# Patient Record
Sex: Male | Born: 1985 | Race: White | Hispanic: No | Marital: Single
Health system: Southern US, Community
[De-identification: ages and names within clinical notes are randomized; demographics above are authoritative.]

---

## 2022-03-04 ENCOUNTER — Other Ambulatory Visit: Payer: Self-pay

## 2022-03-04 ENCOUNTER — Encounter (HOSPITAL_COMMUNITY): Payer: Self-pay

## 2022-03-04 ENCOUNTER — Emergency Department (HOSPITAL_COMMUNITY)
Admission: EM | Admit: 2022-03-04 | Discharge: 2022-03-04 | Disposition: A | Discharge status: Home / Self Care | Payer: BC Managed Care – PPO | Attending: Emergency Medicine | Admitting: Emergency Medicine

## 2022-03-04 DIAGNOSIS — B9789 Other viral agents as the cause of diseases classified elsewhere: Secondary | ICD-10-CM | POA: Insufficient documentation

## 2022-03-04 DIAGNOSIS — R000 Tachycardia, unspecified: Secondary | ICD-10-CM | POA: Insufficient documentation

## 2022-03-04 DIAGNOSIS — Z20822 Contact with and (suspected) exposure to covid-19: Secondary | ICD-10-CM | POA: Insufficient documentation

## 2022-03-04 DIAGNOSIS — J040 Acute laryngitis: Secondary | ICD-10-CM | POA: Diagnosis not present

## 2022-03-04 DIAGNOSIS — J069 Acute upper respiratory infection, unspecified: Secondary | ICD-10-CM | POA: Diagnosis not present

## 2022-03-04 DIAGNOSIS — R059 Cough, unspecified: Secondary | ICD-10-CM | POA: Diagnosis present

## 2022-03-04 LAB — RESP PANEL BY RT-PCR (FLU A&B, COVID) ARPGX2
Influenza A by PCR: NEGATIVE
Influenza B by PCR: NEGATIVE
SARS Coronavirus 2 by RT PCR: NEGATIVE

## 2022-03-04 NOTE — ED Triage Notes (Signed)
Pt reports with sore throat and cough x 3 days.  ?

## 2022-03-04 NOTE — ED Provider Notes (Signed)
?[facility]-EMERGENCY DEPT ?Provider Note ? ? ?CSN: [id number] ?Arrival date & time: 03/04/22  2211 ? ?  ? ?History ? ?Chief Complaint  ?Patient presents with  ? Cough  ? Sore Throat  ? ? ?Richard Everett is a 36 y.o. male complaining of 2 to 3 days of a sore throat, cough and congestion.  Denies any sick contacts.  States he occasionally has chest pain and shortness of breath when he coughs however no other episodes of this.  No leg swelling, history of DVT/PE, fever or chills.  Says he has tried cough drops with him.  He is requesting a work note because he missed work the past few days. ? ? ?Cough ?Associated symptoms: sore throat   ?Sore Throat ? ? ?  ? ?Home Medications ?Prior to Admission medications   ?Not on File  ?   ? ?Allergies    ?Patient has no allergy information on record.   ? ?Review of Systems   ?Review of Systems  ?HENT:  Positive for sore throat and voice change.   ?Respiratory:  Positive for cough.   ? ?Physical Exam ?Updated Vital Signs ?BP (!) 142/89 (BP Location: Left Arm)   Pulse (!) 118   Temp 98.2 ?F (36.8 ?C) (Oral)   Resp 18   Ht 5\' 8"  (1.727 m)   Wt 70.3 kg   SpO2 97%   BMI 23.57 kg/m?  ?Physical Exam ?Vitals and nursing note reviewed.  ?Constitutional:   ?   General: He is not in acute distress. ?   Appearance: Normal appearance. He is not ill-appearing.  ?HENT:  ?   Head: Normocephalic and atraumatic.  ?   Nose: No congestion or rhinorrhea.  ?   Mouth/Throat:  ?   Mouth: Mucous membranes are moist.  ?   Pharynx: Oropharynx is clear.  ?   Tonsils: No tonsillar exudate or tonsillar abscesses.  ?Eyes:  ?   General: No scleral icterus. ?   Conjunctiva/sclera: Conjunctivae normal.  ?Cardiovascular:  ?   Rate and Rhythm: Regular rhythm. Tachycardia present.  ?Pulmonary:  ?   Effort: Pulmonary effort is normal. No respiratory distress.  ?   Breath sounds: Normal breath sounds. No wheezing.  ?Skin: ?   Findings: No rash.  ?Neurological:  ?   Mental Status: He is  alert.  ?Psychiatric:     ?   Mood and Affect: Mood normal.  ? ? ?ED Results / Procedures / Treatments   ?Labs ?(all labs ordered are listed, but only abnormal results are displayed) ?Labs Reviewed  ?RESP PANEL BY RT-PCR (FLU A&B, COVID) ARPGX2  ? ? ?EKG ?None ? ?Radiology ?No results found. ? ?Procedures ?Procedures  ? ?Medications Ordered in ED ?Medications - No data to display ? ?ED Course/ Medical Decision Making/ A&P ?  ?                        ?Medical Decision Making ? ?36 year old male presented due to URI symptoms.  Lung sounds were clear, airway clear and tolerating secretions.  He was offered a chest x-ray however he stated that he did not need this and only needed a work note for the past few days.  I told him that I would be able to write him a work note for today however because I did not evaluate him the previous days that I would not supply a note saying that he was excuse for work.  He voiced understanding  and ambulated out of the department with his discharge papers. ? ?We discussed over-the-counter treatment as well as return precautions.  Prior to his discharge, he admitted to taking kratom, which upon research is a herbal with euphoric results and that it causes his heart rate to be high.  He is convinced that this is why he is tachycardic.  Otherwise, I have no other concern for pulmonary embolus, low likelihood [place], and he will follow-up on COVID and flu results online ? ?Final Clinical Impression(s) / ED Diagnoses ?Final diagnoses:  ?Laryngitis  ?Viral URI with cough  ? ? ?Rx / DC Orders ? ? ?Results and diagnoses were explained to the patient. Return precautions discussed in full. Patient had no additional questions and expressed complete understanding. ? ? ?This chart was dictated using voice recognition software.  Despite best efforts to proofread,  errors can occur which can change the documentation meaning.  ?  ?Saddie Benders, PA-C ?03/04/22 2344 ? ?  ?Benjiman Core,  MD ?03/04/22 2356 ? ?

## 2022-03-04 NOTE — Discharge Instructions (Signed)
You are most likely experiencing allergies which is causing you to have phlegm, sore throat and cough.  You should start taking a medication such as Claritin, Zyrtec, Allegra or Xyzal daily.  Your work note is attached ?

## 2022-03-22 ENCOUNTER — Emergency Department (HOSPITAL_COMMUNITY)
Admission: EM | Admit: 2022-03-22 | Discharge: 2022-03-22 | Disposition: A | Discharge status: Home / Self Care | Payer: BC Managed Care – PPO | Attending: Emergency Medicine | Admitting: Emergency Medicine

## 2022-03-22 ENCOUNTER — Other Ambulatory Visit: Payer: Self-pay

## 2022-03-22 DIAGNOSIS — Z5900 Homelessness unspecified: Secondary | ICD-10-CM | POA: Insufficient documentation

## 2022-03-22 DIAGNOSIS — F989 Unspecified behavioral and emotional disorders with onset usually occurring in childhood and adolescence: Secondary | ICD-10-CM | POA: Diagnosis present

## 2022-03-22 DIAGNOSIS — F919 Conduct disorder, unspecified: Secondary | ICD-10-CM | POA: Diagnosis not present

## 2022-03-22 DIAGNOSIS — R4689 Other symptoms and signs involving appearance and behavior: Secondary | ICD-10-CM

## 2022-03-22 NOTE — ED Triage Notes (Signed)
Pt arrives via [employer] who were reportedly called out for "a naked man on the sidewalk". Pt was in no distress stated "I just wanted to be naked". Denies SI/HI. No AVH. When asked about recent drug/alcohol use pt states "I don't think so, can you check". Pt states he is homeless, from [place]. Here in [place] because "my truck ran out of gas".  ?

## 2022-03-22 NOTE — ED Notes (Signed)
Patient changed into scrubs and wanded by security. ?

## 2022-03-22 NOTE — ED Provider Notes (Signed)
?[facility]-EMERGENCY DEPT ?Provider Note ? ? ?CSN: [id number] ?Arrival date & time: 03/22/22  1738 ? ?  ? ?History ?Pmh: substance use disorder ?Chief Complaint  ?Patient presents with  ? Mental Health Problem  ? Homeless  ? ? ?Richard Everett is a 36 y.o. male. ?Patient presents with for mental health evaluation.  Apparently he was arrested earlier this afternoon after being found breaking into cars.  He was found to have heroin on him.  He was released from jail today and was at a friend's house when he was reportedly running around naked outside.  Neighbor called EMS on him.  Apparently patient is homeless right now and is traveling from [place].  He denies any suicidal or homicidal ideations.  Denies any illicit lesions.  He denies any recent drug or alcohol use.  Denies any mental health disorders and is not on any medications. ? ? ?Mental Health Problem ?Presenting symptoms: no agitation, no hallucinations and no suicidal thoughts   ?Associated symptoms: no anxiety   ? ?  ? ?Home Medications ?Prior to Admission medications   ?Not on File  ?   ? ?Allergies    ?Patient has no known allergies.   ? ?Review of Systems   ?Review of Systems  ?Psychiatric/Behavioral:  Positive for behavioral problems. Negative for agitation, confusion, hallucinations, sleep disturbance and suicidal ideas. The patient is not nervous/anxious.   ?All other systems reviewed and are negative. ? ?Physical Exam ?Updated Vital Signs ?BP 128/88 (BP Location: Right Arm)   Pulse 97   Temp 99.2 ?F (37.3 ?C) (Oral)   Resp 16   Ht 5\' 8"  (1.727 m)   Wt 57.6 kg   SpO2 100%   BMI 19.31 kg/m?  ?Physical Exam ?Vitals and nursing note reviewed.  ?Constitutional:   ?   General: He is not in acute distress. ?   Appearance: Normal appearance. He is well-developed. He is not ill-appearing, toxic-appearing or diaphoretic.  ?HENT:  ?   Head: Normocephalic and atraumatic.  ?   Nose: No nasal deformity.  ?   Mouth/Throat:  ?   Lips:  Pink. No lesions.  ?Eyes:  ?   General: Gaze aligned appropriately. No scleral icterus.    ?   Right eye: No discharge.     ?   Left eye: No discharge.  ?   Conjunctiva/sclera: Conjunctivae normal.  ?   Right eye: Right conjunctiva is not injected. No exudate or hemorrhage. ?   Left eye: Left conjunctiva is not injected. No exudate or hemorrhage. ?Pulmonary:  ?   Effort: Pulmonary effort is normal. No respiratory distress.  ?Skin: ?   General: Skin is warm and dry.  ?Neurological:  ?   Mental Status: He is alert and oriented to person, place, and time.  ?Psychiatric:     ?   Attention and Perception: Attention and perception normal. He is attentive. He does not perceive auditory or visual hallucinations.     ?   Mood and Affect: Mood and affect normal. Mood is not anxious, depressed or elated. Affect is not labile, blunt, flat, angry, tearful or inappropriate.     ?   Speech: Speech normal. He is communicative. Speech is not rapid and pressured, delayed, slurred or tangential.     ?   Behavior: Behavior normal. Behavior is not agitated, slowed, aggressive, withdrawn, hyperactive or combative. Behavior is cooperative.     ?   Thought Content: Thought content normal. Thought content is not paranoid  or delusional. Thought content does not include homicidal or suicidal ideation. Thought content does not include homicidal or suicidal plan.     ?   Cognition and Memory: Cognition and memory normal. Cognition is not impaired. Memory is not impaired. He does not exhibit impaired recent memory or impaired remote memory.     ?   Judgment: Judgment normal. Judgment is not impulsive or inappropriate.  ? ? ?ED Results / Procedures / Treatments   ?Labs ?(all labs ordered are listed, but only abnormal results are displayed) ?Labs Reviewed - No data to display ? ?EKG ?None ? ?Radiology ?No results found. ? ?Procedures ?Procedures  ? ? ?Medications Ordered in ED ?Medications - No data to display ? ?ED Course/ Medical Decision  Making/ A&P ?  ?                        ?Medical Decision Making ? ? ?MDM  ?This is a 36 y.o. male who presents to the ED for running around naked in public ? ?My Impression, Plan, and ED Course: Patient is calm and cooperative.  He denies any suicidal or homicidal ideations.  Not appear to be responding to any external stimuli and reports no history of hallucinations he was brought in because he was running around naked and EMS was called on him. He is homeless and would like something to eat. Report from EMS states that he was arrested earlier today and heroin was found on him so this may be why he was acting strange. At this point, I do not think that he is a harm to himself or others. No evidence of an acute psychiatric process going on.  He wants to be discharged at this time. No further workup needed.  ? ? ? ? ?Charting Requirements ?Additional history is obtained from:  Independent historian ?External Records from outside source obtained and reviewed including: n/a ?Social Determinants of Health:  homeless, from out of town ?Pertinant PMH that complicates patient's illness: homelessness, substance use ? ?Patient Care ?Problems that were addressed during this visit: ?- Behavior concern: Acute illness ?- homeless: Acute illness ?Disposition: Discharge. Return precautions ? ?This is a supervised visit with my attending physician, Dr. Freida Busman. We have discussed this patient and they have altered the plan as needed. ? ?Portions of this note were generated with Scientist, clinical (histocompatibility and immunogenetics). Dictation errors may occur despite best attempts at proofreading. ?  ? ?Final Clinical Impression(s) / ED Diagnoses ?Final diagnoses:  ?Homelessness  ?Abnormal behavior  ? ? ?Rx / DC Orders ?ED Discharge Orders   ? ? None  ? ?  ? ? ?  ?Claudie Leach, PA-C ?03/22/22 1915 ? ?  ?Lorre Nick, MD ?03/22/22 2314 ? ?

## 2022-03-22 NOTE — ED Notes (Signed)
Pt changed into purple scrubs. Belongings stored in cabinet by nurses station 16-22.  ?

## 2022-03-22 NOTE — Discharge Instructions (Signed)
Please return if you develop any suicidal or homicidal thoughts.  ?

## 2022-04-11 ENCOUNTER — Emergency Department (HOSPITAL_COMMUNITY): Payer: BC Managed Care – PPO

## 2022-04-11 ENCOUNTER — Emergency Department (HOSPITAL_COMMUNITY)
Admission: EM | Admit: 2022-04-11 | Discharge: 2022-04-12 | Disposition: A | Discharge status: Home / Self Care | Payer: BC Managed Care – PPO | Attending: Emergency Medicine | Admitting: Emergency Medicine

## 2022-04-11 ENCOUNTER — Encounter (HOSPITAL_COMMUNITY): Payer: Self-pay | Admitting: Emergency Medicine

## 2022-04-11 DIAGNOSIS — R4182 Altered mental status, unspecified: Secondary | ICD-10-CM | POA: Diagnosis not present

## 2022-04-11 DIAGNOSIS — E876 Hypokalemia: Secondary | ICD-10-CM | POA: Insufficient documentation

## 2022-04-11 LAB — COMPREHENSIVE METABOLIC PANEL
ALT: 30 U/L (ref 0–44)
AST: 28 U/L (ref 15–41)
Albumin: 3 g/dL — ABNORMAL LOW (ref 3.5–5.0)
Alkaline Phosphatase: 53 U/L (ref 38–126)
Anion gap: 6 (ref 5–15)
BUN: 15 mg/dL (ref 6–20)
CO2: 27 mmol/L (ref 22–32)
Calcium: 8.2 mg/dL — ABNORMAL LOW (ref 8.9–10.3)
Chloride: 107 mmol/L (ref 98–111)
Creatinine, Ser: 0.96 mg/dL (ref 0.61–1.24)
GFR, Estimated: >60 mL/min (ref >60)
Glucose, Bld: 108 mg/dL — ABNORMAL HIGH (ref 70–99)
Potassium: 3.2 mmol/L — ABNORMAL LOW (ref 3.5–5.1)
Sodium: 140 mmol/L (ref 135–145)
Total Bilirubin: 0.9 mg/dL (ref 0.3–1.2)
Total Protein: 5.9 g/dL — ABNORMAL LOW (ref 6.5–8.1)

## 2022-04-11 LAB — CBC
HCT: 28.8 % — ABNORMAL LOW (ref 39.0–52.0)
Hemoglobin: 10.1 g/dL — ABNORMAL LOW (ref 13.0–17.0)
MCH: 33.7 pg (ref 26.0–34.0)
MCHC: 35.1 g/dL (ref 30.0–36.0)
MCV: 96 fL (ref 80.0–100.0)
Platelets: 204 10*3/uL (ref 150–400)
RBC: 3 MIL/uL — ABNORMAL LOW (ref 4.22–5.81)
RDW: 12.1 % (ref 11.5–15.5)
WBC: 7.1 10*3/uL (ref 4.0–10.5)
nRBC: 0 % (ref 0.0–0.2)

## 2022-04-11 LAB — ETHANOL: Alcohol, Ethyl (B): <10 mg/dL (ref <10)

## 2022-04-11 LAB — CBG MONITORING, ED: Glucose-Capillary: 103 mg/dL — ABNORMAL HIGH (ref 70–99)

## 2022-04-11 MED ORDER — POTASSIUM CHLORIDE 10 MEQ/100ML IV SOLN
10.0000 meq | INTRAVENOUS | Status: AC
  Administered 2022-04-11 – 2022-04-12 (×3): 10 meq via INTRAVENOUS
  Filled 2022-04-11 (×3): qty 100

## 2022-04-11 NOTE — Discharge Instructions (Addendum)
Substance Abuse Treatment Programs ° °Intensive Outpatient Programs °[facility]     °[address]      °[place], [place]                   °[phone]      ° °[facility] °[address] °[place], [place] °[phone] ° °[facility]     °(Inpatient and outpatient)     °[address]           °[phone]   ° °[facility] °[phone] (Suboxone and Methadone) ° °[address]      °[place], [place] [address]      °[phone]      ° °[address] °[place], [place] °[phone] ° °[facility] (Outpatient/Inpatient, Chemical)    °(insurance only) [phone]      °       °[facility] (Groups & Residential) °[place], [place] °[phone] ° °   °[facility]     °[address]     °[place], [place]      °[phone]      ° °[facility] (for caregivers and family) °[address] °[place], [place] °336-299-4655 ° ° ° ° ° °Residential Treatment Programs °[facility]      °[address], [place], [place] [address]  °[phone]      ° °[facility] °[address] , [place] [address] °[phone] ° °[facility]        °336-248-8914      ° °[facility] °1-800-659-3381 ° °[facility]             °[address]                                         °[place], [place]                                                °[phone] or 336-784-9470                              ° °[facility] [place] °[address] °[place], [address] °[phone] ° °[facility]    °[address]      °[place], [place]     °[phone]      ° °[facility] °[address] °[place], [place] °336-285-9073 ° °[facility]   °[address]     °[place], [place] [address]     °[phone]      °Admissions: 8am-3pm M-F ° °[facility] °[address] °[place],  [place] °336-227-7417 ° °[facility]: Residential Program (90 Days)   °[place], [place]      °336-725-8389 or [phone]    ° °[facility] °[place], [place] °(Walk in Hours over the weekend or by referral) ° °[facility] °[address], [place],  [address] °([phone] ° °Crisis Mobile: [facility]:  1-877-626-1772 (for crisis response 24 hours a day) °[facility] Hotline:      1-800-256-2452 °Outpatient Psychiatry and Counseling ° °[facility]: Mobile Crisis   Management 24 hours:  1-877-626-1772 ° °[facility] sliding scale fee and walk in schedule: M-F 8am-12pm/1pm-3pm °[address]  °[place], [place] [address] °[phone] ° °[facility] °[address] °[place], [place] [address] °[phone] ° °[facility] ([facility] new patient walk-in appointments available Monday - Friday 8am -3pm.          °[address] °[place], [place] [address] °336-676-6840 or crisis line- 336-676-6905 ° °[facility] Intensive Outpatient Therapy Program °[address] °[place], [place] [address] °336-832-9804 ° °[facility]                  °Crisis Services      °[phone]      °[address]     °[place], [place] [address]                ° °[facility]   °[facility] °[phone] °[address] °[place], [place] [address] ° ° °[facility]          °[address]  °[place], [place] [address]       °[phone] ° °[facility] °[address] °[place], [place] [address] °336-292-1510 ° °[facility]    °[address]    °[place], [place] [address]     °[phone]      ° °Parish McKinney, MD     °[address]     °[place] [address]     °[phone]     °  °[facility] °[address] °[place] [address] ° °[facility]     °[address]     °[place], [place]      °336-542-2076      ° °[facility] °Shamsher Ahluwalia, MD °[address] °[place], [place] [address] °[phone] ° °[facility]     °[address]     °[place], [place] [address]     °336-274-1237      ° °[facility] °[address] °[place], [place] [address] °336-854-4450 °Resources for Temporary Residential Assistance/Crisis Centers ° °DAY CENTERS °[facility] °M-F 8am-3pm   °[address] [place], [place] [address]   336-332-0824 °Services include: laundry, barbering, support groups, case management, phone  & computer access, showers, AA/NA mtgs, mental health/substance abuse nurse, job skills class, disability information, VA assistance, spiritual classes, etc.  ° °HOMELESS SHELTERS ° °[facility]     °[facility]   °[address], [place]     °[phone]       °       °[facility] (women and children)       °[address] °[place], [place] [address] °336-275-0820 °[email or web address] for application and process °Application Required ° °[facility]   °[address]    °[place] [address]     °[phone]       °             °[facility] °[address] °[place], [place] [address] °336.273.5572 °336-235-0363(schedule application appt.) °Application Required ° °[facility] (women only)    °[address]     °[place],  [address]     °336-884-1039      °  Intake starts 6pm daily °Need valid ID, SSC, & Police report °[facility] [place] °[address] °[place], [place] °336-881-5420 °Application Required ° °[facility] (men only)     °[address]      °[place], [place]     °[phone]      ° °[facility] °(Pregnant women only) °[address] °[place], [place] °336-275-0206 ° °[facility]      °[address]      °[place], [place] [address]     °[phone]      °       °[facility] °[address] °[place], [place] °336-723-1848 °90 day commitment/SA/Application process ° °[facility](men only)     °[address]     °[place], [place]     °[phone]       °Check-in at 7pm     °       °[facility] °[address] °[place], [place] [address] °336-248-6684 °Men/Women/Women and Children must be there by 7 pm ° °[facility] °[place], [place] °336-722-8721                ° °

## 2022-04-11 NOTE — ED Provider Notes (Signed)
[facility] EMERGENCY DEPARTMENT Provider Note   CSN: [id number] Arrival date & time: 04/11/22  2116     History  No chief complaint on file.   Richard Everett is a 36 y.o. male with a history of polysubstance abuse and homelessness presenting to the ED with altered mental status.  Per EMS, patient was found in a storm drain and was removed by police.  No reports that he fell into the storm drain.  On EMS arrival, he was alert but disoriented and intermittently agitated on route.  No seizure-like activity.  He initially would respond to his name but would not answer questions.  On arrival to the ED, patient is altered and unable to answer questions or provide further history.  HPI     Home Medications Prior to Admission medications   Not on File      Allergies    Patient has no known allergies.    Review of Systems   Review of Systems  Unable to perform ROS: Mental status change   Physical Exam Updated Vital Signs BP 138/80   Pulse (!) 36   Temp 98.3 F (36.8 C) (Temporal)   Resp (!) 21   SpO2 91%  Physical Exam Constitutional:      Appearance: He is normal weight.     Comments: Disheveled and chronically ill-appearing. He is initially alert and combative on arrival but will intermittently fall asleep.  Upon attempting to open his eyes, he will intermittently fight opening his eyes.  HENT:     Head: Normocephalic and atraumatic.     Right Ear: External ear normal.     Left Ear: External ear normal.     Nose: Nose normal.     Mouth/Throat:     Pharynx: Oropharynx is clear.     Comments: Poor dentition. Eyes:     General: No scleral icterus.    Comments: Fields are 6 mm and reactive bilaterally.  Neck:     Comments: C-collar in place.  No midline C-spine tenderness, step-offs, or deformities. Cardiovascular:     Rate and Rhythm: Normal rate and regular rhythm.     Pulses: Normal pulses.     Heart sounds: Normal heart sounds. No murmur  heard.   No friction rub. No gallop.  Pulmonary:     Effort: Pulmonary effort is normal. No respiratory distress.     Breath sounds: Normal breath sounds. No stridor. No wheezing, rhonchi or rales.  Abdominal:     General: There is no distension.     Palpations: Abdomen is soft.     Tenderness: There is no abdominal tenderness. There is no guarding or rebound.  Musculoskeletal:        General: No deformity.     Cervical back: Neck supple.     Right lower leg: No edema.     Left lower leg: No edema.  Skin:    General: Skin is warm and dry.  Neurological:     Comments: Patient unable to answer questions.  He will withdrawal to pain.  He has intermittent episodes where his body will tense and he appears agitated.  No eye deviation or tachycardia with these episodes and they do not appear to be consistent with seizures.  He is able to move all extremities spontaneously. 3 beat clonus in the bilateral lower extremities.    ED Results / Procedures / Treatments   Labs (all labs ordered are listed, but only abnormal results are displayed) Labs Reviewed  CBC - Abnormal; Notable for the following components:      Result Value   RBC 3.00 (*)    Hemoglobin 10.1 (*)    HCT 28.8 (*)    All other components within normal limits  COMPREHENSIVE METABOLIC PANEL - Abnormal; Notable for the following components:   Potassium 3.2 (*)    Glucose, Bld 108 (*)    Calcium 8.2 (*)    Total Protein 5.9 (*)    Albumin 3.0 (*)    All other components within normal limits  CBG MONITORING, ED - Abnormal; Notable for the following components:   Glucose-Capillary 103 (*)    All other components within normal limits  ETHANOL  RAPID URINE DRUG SCREEN, HOSP PERFORMED  CBG MONITORING, ED    EKG EKG Interpretation  Date/Time:  Friday April 11 2022 21:23:09 EDT Ventricular Rate:  91 PR Interval:  131 QRS Duration: 99 QT Interval:  385 QTC Calculation: 474 R Axis:   88 Text Interpretation: Sinus rhythm  Nonspecific T wave abnormality No previous tracing Confirmed by Cathren Laine ([id number]) on 04/11/2022 11:28:00 PM  Radiology CT Head Wo Contrast  Result Date: 04/11/2022 CLINICAL DATA:  Disoriented EXAM: CT HEAD WITHOUT CONTRAST CT CERVICAL SPINE WITHOUT CONTRAST TECHNIQUE: Multidetector CT imaging of the head and cervical spine was performed following the standard protocol without intravenous contrast. Multiplanar CT image reconstructions of the cervical spine were also generated. RADIATION DOSE REDUCTION: This exam was performed according to the departmental dose-optimization program which includes automated exposure control, adjustment of the mA and/or kV according to patient size and/or use of iterative reconstruction technique. COMPARISON:  None Available. FINDINGS: CT HEAD FINDINGS Brain: No evidence of acute infarction, hemorrhage, hydrocephalus, extra-axial collection or mass lesion/mass effect. Vascular: No hyperdense vessel or unexpected calcification. Skull: Normal. Negative for fracture or focal lesion. Sinuses/Orbits: Mucosal thickening in the sinuses Other: None CT CERVICAL SPINE FINDINGS Alignment: Straightening of the cervical spine. No subluxation. Facet alignment within normal limits. Skull base and vertebrae: No acute fracture. No primary bone lesion or focal pathologic process. Soft tissues and spinal canal: No prevertebral fluid or swelling. No visible canal hematoma. Disc levels:  Within normal limits Upper chest: Negative. Other: None IMPRESSION: 1. Negative non contrasted CT appearance of the brain. 2. Straightening of the cervical spine. No acute osseous abnormality. Electronically Signed   By: Jasmine Pang M.D.   On: 04/11/2022 22:10   CT Cervical Spine Wo Contrast  Result Date: 04/11/2022 CLINICAL DATA:  Disoriented EXAM: CT HEAD WITHOUT CONTRAST CT CERVICAL SPINE WITHOUT CONTRAST TECHNIQUE: Multidetector CT imaging of the head and cervical spine was performed following the standard  protocol without intravenous contrast. Multiplanar CT image reconstructions of the cervical spine were also generated. RADIATION DOSE REDUCTION: This exam was performed according to the departmental dose-optimization program which includes automated exposure control, adjustment of the mA and/or kV according to patient size and/or use of iterative reconstruction technique. COMPARISON:  None Available. FINDINGS: CT HEAD FINDINGS Brain: No evidence of acute infarction, hemorrhage, hydrocephalus, extra-axial collection or mass lesion/mass effect. Vascular: No hyperdense vessel or unexpected calcification. Skull: Normal. Negative for fracture or focal lesion. Sinuses/Orbits: Mucosal thickening in the sinuses Other: None CT CERVICAL SPINE FINDINGS Alignment: Straightening of the cervical spine. No subluxation. Facet alignment within normal limits. Skull base and vertebrae: No acute fracture. No primary bone lesion or focal pathologic process. Soft tissues and spinal canal: No prevertebral fluid or swelling. No visible canal hematoma. Disc levels:  Within normal limits  Upper chest: Negative. Other: None IMPRESSION: 1. Negative non contrasted CT appearance of the brain. 2. Straightening of the cervical spine. No acute osseous abnormality. Electronically Signed   By: Jasmine PangKim  Fujinaga M.D.   On: 04/11/2022 22:10   DG Chest Portable 1 View  Result Date: 04/11/2022 CLINICAL DATA:  Altered mental status. EXAM: PORTABLE CHEST 1 VIEW COMPARISON:  None Available. FINDINGS: The heart size and mediastinal contours are within normal limits. Both lungs are clear. The visualized skeletal structures are unremarkable. IMPRESSION: No active disease. Electronically Signed   By: Darliss CheneyAmy  Guttmann M.D.   On: 04/11/2022 21:40    Procedures Procedures    Medications Ordered in ED Medications  potassium chloride 10 mEq in 100 mL IVPB (10 mEq Intravenous New Bag/Given 04/11/22 2222)    ED Course/ Medical Decision Making/ A&P                            Medical Decision Making Amount and/or Complexity of Data Reviewed Labs: ordered. Radiology: ordered.  Risk Prescription drug management.   36 year old male with a history of polysubstance use and homelessness presenting to the ED with altered mental status  On exam, the patient is afebrile and hemodynamically stable.  He is altered and unable to answer questions but is maintaining his own airway and breathing normally.  He does intermittently have episodes where his entire body will tense but he then he appears somewhat combative and will then calm.  He does not have eye deviation or tachycardia with these episodes that do not appear to be consistent with seizures.  He is noted to have some clonus in your lower extremities.  Unclear if the patient had a traumatic event, however he does not have any obvious trauma on my examination.  However, given his altered mental status and unclear nature of the surrounding circumstances, CT head and CT C-spine were obtained.  Chest x-ray also obtained.  I think his altered mental status is most likely due to polysubstance use, will evaluate for electrolyte abnormalities or intracranial abnormality.  CT head without acute intracranial abnormality.  CT C-spine without acute traumatic injury.  Chest x-ray is within normal limits.  CBC without leukocytosis and with a hemoglobin of 10.1.  CMP with mild hypokalemia to 3.2.  Patient given repletion.  CMP otherwise unremarkable.  Ethanol is not detectable.  POCT glucose on arrival is 103.  On reevaluation, upon staining the patient's name, he does sit up and open his eyes but still does not answer questions and lays back down to go to sleep.  We will continue to monitor and allow him to metabolize with plans to continually reassess.  Patient care signed out to Dr. Bebe ShaggyWickline @2330 .        Final Clinical Impression(s) / ED Diagnoses Final diagnoses:  Altered mental status, unspecified altered  mental status type    Rx / DC Orders ED Discharge Orders     None         Laurence ComptonFoster, Iasia Forcier, MD 04/11/22 2345    Zadie RhineWickline, Donald, MD 04/12/22 [phone]

## 2022-04-11 NOTE — ED Triage Notes (Signed)
Pt bib [facility] from a stormdrain off [employer], called by bystander, alert and disoriented, and became very agitated en route. Pt responds to his name but will not speak at the time of this note. No seizure-like activity, but noted to tense and flex his arms. Arrives in cervical collar for precautions. Known heroin use, unknown if used today. Pupils are not pinpoint and are reactive to light.   18G IV in L arm, no meds/fluids given  121/84 Pulse 75 CBG 120 99% room air

## 2022-04-12 NOTE — ED Notes (Signed)
E-signature pad unavailable at time of pt discharge. This RN discussed discharge materials with pt and answered all pt questions. Pt stated understanding of discharge material. ? ?

## 2022-04-12 NOTE — ED Notes (Signed)
Pt now more awake, alert, and able to answer questions appropriately. Stated to this RN that he does not recall events from last night or coming into the hospital. This RN informed pt that he was found in a storm drain, which he also does not recall. Pt also stated that he feels too weak to ambulate self at this time. RN reminded pt of need for urine specimen and reiterated that urinal is available at the bedside for use.

## 2022-04-12 NOTE — ED Notes (Signed)
This NT got the patient up and walking around the room. Pt was able to keep balance on his own and stand and walk.

## 2022-04-12 NOTE — ED Notes (Signed)
RN provided urinal to pt and reminded pt of need for urine specimen

## 2022-11-12 ENCOUNTER — Emergency Department (HOSPITAL_COMMUNITY)
Admission: EM | Admit: 2022-11-12 | Discharge: 2022-11-13 | Disposition: A | Discharge status: Home / Self Care | Payer: Self-pay | Attending: Emergency Medicine | Admitting: Emergency Medicine

## 2022-11-12 ENCOUNTER — Emergency Department (HOSPITAL_COMMUNITY): Payer: Self-pay

## 2022-11-12 DIAGNOSIS — T50901A Poisoning by unspecified drugs, medicaments and biological substances, accidental (unintentional), initial encounter: Secondary | ICD-10-CM | POA: Insufficient documentation

## 2022-11-12 DIAGNOSIS — F1110 Opioid abuse, uncomplicated: Secondary | ICD-10-CM | POA: Insufficient documentation

## 2022-11-12 DIAGNOSIS — F1910 Other psychoactive substance abuse, uncomplicated: Secondary | ICD-10-CM | POA: Insufficient documentation

## 2022-11-12 DIAGNOSIS — L02512 Cutaneous abscess of left hand: Secondary | ICD-10-CM | POA: Insufficient documentation

## 2022-11-12 DIAGNOSIS — Z1152 Encounter for screening for COVID-19: Secondary | ICD-10-CM | POA: Insufficient documentation

## 2022-11-12 DIAGNOSIS — D72829 Elevated white blood cell count, unspecified: Secondary | ICD-10-CM | POA: Insufficient documentation

## 2022-11-12 DIAGNOSIS — S0081XA Abrasion of other part of head, initial encounter: Secondary | ICD-10-CM | POA: Insufficient documentation

## 2022-11-12 DIAGNOSIS — Z5900 Homelessness unspecified: Secondary | ICD-10-CM | POA: Insufficient documentation

## 2022-11-12 DIAGNOSIS — X58XXXA Exposure to other specified factors, initial encounter: Secondary | ICD-10-CM | POA: Insufficient documentation

## 2022-11-12 LAB — COMPREHENSIVE METABOLIC PANEL
ALT: 33 U/L (ref 0–44)
AST: 38 U/L (ref 15–41)
Albumin: 3.6 g/dL (ref 3.5–5.0)
Alkaline Phosphatase: 52 U/L (ref 38–126)
Anion gap: 9 (ref 5–15)
BUN: 15 mg/dL (ref 6–20)
CO2: 20 mmol/L — ABNORMAL LOW (ref 22–32)
Calcium: 8 mg/dL — ABNORMAL LOW (ref 8.9–10.3)
Chloride: 103 mmol/L (ref 98–111)
Creatinine, Ser: 0.88 mg/dL (ref 0.61–1.24)
GFR, Estimated: >60 mL/min (ref >60)
Glucose, Bld: 98 mg/dL (ref 70–99)
Potassium: 4.2 mmol/L (ref 3.5–5.1)
Sodium: 132 mmol/L — ABNORMAL LOW (ref 135–145)
Total Bilirubin: 1.3 mg/dL — ABNORMAL HIGH (ref 0.3–1.2)
Total Protein: 6.5 g/dL (ref 6.5–8.1)

## 2022-11-12 LAB — CBC WITH DIFFERENTIAL/PLATELET
Abs Immature Granulocytes: 0.05 10*3/uL (ref 0.00–0.07)
Basophils Absolute: 0 10*3/uL (ref 0.0–0.1)
Basophils Relative: 0 %
Eosinophils Absolute: 0.1 10*3/uL (ref 0.0–0.5)
Eosinophils Relative: 1 %
HCT: 33 % — ABNORMAL LOW (ref 39.0–52.0)
Hemoglobin: 11.4 g/dL — ABNORMAL LOW (ref 13.0–17.0)
Immature Granulocytes: 0 %
Lymphocytes Relative: 6 %
Lymphs Abs: 0.7 10*3/uL (ref 0.7–4.0)
MCH: 30.2 pg (ref 26.0–34.0)
MCHC: 34.5 g/dL (ref 30.0–36.0)
MCV: 87.3 fL (ref 80.0–100.0)
Monocytes Absolute: 1.2 10*3/uL — ABNORMAL HIGH (ref 0.1–1.0)
Monocytes Relative: 10 %
Neutro Abs: 9.4 10*3/uL — ABNORMAL HIGH (ref 1.7–7.7)
Neutrophils Relative %: 83 %
Platelets: 241 10*3/uL (ref 150–400)
RBC: 3.78 MIL/uL — ABNORMAL LOW (ref 4.22–5.81)
RDW: 13.2 % (ref 11.5–15.5)
WBC: 11.5 10*3/uL — ABNORMAL HIGH (ref 4.0–10.5)
nRBC: 0 % (ref 0.0–0.2)

## 2022-11-12 LAB — LACTIC ACID, PLASMA: Lactic Acid, Venous: 1.2 mmol/L (ref 0.5–1.9)

## 2022-11-12 LAB — SALICYLATE LEVEL: Salicylate Lvl: <7 mg/dL — ABNORMAL LOW (ref 7.0–30.0)

## 2022-11-12 LAB — ETHANOL: Alcohol, Ethyl (B): <10 mg/dL (ref <10)

## 2022-11-12 LAB — ACETAMINOPHEN LEVEL: Acetaminophen (Tylenol), Serum: <10 ug/mL — ABNORMAL LOW (ref 10–30)

## 2022-11-12 MED ORDER — LORAZEPAM 2 MG/ML IJ SOLN
2.0000 mg | Freq: Once | INTRAMUSCULAR | Status: AC
  Administered 2022-11-12: 2 mg via INTRAMUSCULAR
  Filled 2022-11-12: qty 1

## 2022-11-12 MED ORDER — SODIUM CHLORIDE 0.9 % IV BOLUS: 1000.0000 mL | Freq: Once | INTRAVENOUS | Status: DC

## 2022-11-12 MED ORDER — LORAZEPAM 2 MG/ML IJ SOLN
1.0000 mg | Freq: Once | INTRAMUSCULAR | Status: DC
Start: 2022-11-12 — End: 2022-11-12

## 2022-11-12 NOTE — ED Triage Notes (Signed)
Patient bib [facility] from homeless [place]. Per ems the homeless camp gave him 3 vials of narcan. Patient was awake on their arrival rolling around on the ground after taking fentanyl. Patient is mumbling and only alert to himself. VSS.

## 2022-11-12 NOTE — ED Notes (Signed)
Pt yelling out, increasing agitation. Pt then proceeded to get out of bed, strip naked, pull out IV, all monitoring cords, and urinate on the floor. Pt not redirectable at this time. MD aware, order for IM ativan obtained. Security called. Pt appears to be defecating in the trash can at time of RN return to room with medication. Pt refused medication. MD made aware and decision to IVC pt made and process started. Pt was medicated with the assistance of security with minimal physical resistance after lengthy verbal discussion.

## 2022-11-12 NOTE — ED Notes (Signed)
Pt only allowed nurse to get bp. Refusing to dress outing purple clothes. Resisting from nurse when attempting to assess and outdress him.

## 2022-11-12 NOTE — ED Notes (Signed)
Pt laying in bed refusing to get changed into purple scrubs

## 2022-11-12 NOTE — ED Notes (Signed)
Pt laying on ground, refusing vitals at this time

## 2022-11-12 NOTE — ED Provider Notes (Signed)
[facility] EMERGENCY DEPARTMENT Provider Note   CSN: [id number] Arrival date & time: 11/12/22  1510     History  Chief Complaint  Patient presents with   Drug Overdose    Richard Everett is a 37 y.o. male.  Patient with history of homelessness, polysubstance abuse --presents by EMS today from a homeless encampment.  Per EMS report, patient was given 3 doses of Narcan prior to EMS arrival.  Patient awake, mumbling speech with EMS.  No further medications.  IV initiated.  Patient states that he was using fentanyl.       Home Medications Prior to Admission medications   Not on File      Allergies    Patient has no known allergies.    Review of Systems   Review of Systems  Physical Exam Updated Vital Signs BP (!) 141/107   Pulse (!) 115   Temp (!) 101.3 F (38.5 C)   Resp 17   Ht 5\' 8"  (1.727 m)   Wt 61.2 kg   SpO2 98%   BMI 20.53 kg/m   Physical Exam Vitals and nursing note reviewed.  Constitutional:      General: He is not in acute distress.    Appearance: He is well-developed.  HENT:     Head: Normocephalic.     Comments: Several small shallow abrasions over the forehead area    Right Ear: External ear normal.     Left Ear: External ear normal.     Nose: Nose normal.     Mouth/Throat:     Mouth: Mucous membranes are dry.  Eyes:     General:        Right eye: No discharge.        Left eye: No discharge.     Conjunctiva/sclera: Conjunctivae normal.  Cardiovascular:     Rate and Rhythm: Regular rhythm. Tachycardia present.     Heart sounds: Normal heart sounds.  Pulmonary:     Effort: Pulmonary effort is normal. No respiratory distress.     Breath sounds: Normal breath sounds.  Abdominal:     Palpations: Abdomen is soft.     Tenderness: There is no abdominal tenderness. There is no guarding or rebound.  Musculoskeletal:     Cervical back: Normal range of motion and neck supple.  Skin:    General: Skin is warm and dry.   Neurological:     Mental Status: He is alert.     GCS: GCS eye subscore is 4. GCS verbal subscore is 5. GCS motor subscore is 6.     Motor: Motor function is intact.     Comments: Patient moves all extremities purposefully.  He will answer questions, otherwise mumbling speech, talking to himself.     ED Results / Procedures / Treatments   Labs (all labs ordered are listed, but only abnormal results are displayed) Labs Reviewed  CBC WITH DIFFERENTIAL/PLATELET - Abnormal; Notable for the following components:      Result Value   WBC 11.5 (*)    RBC 3.78 (*)    Hemoglobin 11.4 (*)    HCT 33.0 (*)    Neutro Abs 9.4 (*)    Monocytes Absolute 1.2 (*)    All other components within normal limits  COMPREHENSIVE METABOLIC PANEL - Abnormal; Notable for the following components:   Sodium 132 (*)    CO2 20 (*)    Calcium 8.0 (*)    Total Bilirubin 1.3 (*)    All  other components within normal limits  SALICYLATE LEVEL - Abnormal; Notable for the following components:   Salicylate Lvl <5.2 (*)    All other components within normal limits  ACETAMINOPHEN LEVEL - Abnormal; Notable for the following components:   Acetaminophen (Tylenol), Serum <10 (*)    All other components within normal limits  LACTIC ACID, PLASMA  ETHANOL  LACTIC ACID, PLASMA  RAPID URINE DRUG SCREEN, HOSP PERFORMED    EKG EKG Interpretation  Date/Time:  Wednesday November 12 2022 15:11:01 EST Ventricular Rate:  103 PR Interval:  123 QRS Duration: 90 QT Interval:  332 QTC Calculation: 435 R Axis:   81 Text Interpretation: Sinus tachycardia RSR' in V1 or V2, right VCD or RVH Consider left ventricular hypertrophy No acute changes No significant change since last tracing Confirmed by Varney Biles [phone]) on 11/12/2022 7:38:08 PM  Radiology DG Chest Portable 1 View  Result Date: 11/12/2022 CLINICAL DATA:  Fever.  Substance abuse. EXAM: PORTABLE CHEST 1 VIEW COMPARISON:  04/11/2022 FINDINGS: Heart size and  mediastinal contours are unremarkable. There is no pleural effusion or edema. No airspace opacities. Visualized osseous structures are unremarkable. IMPRESSION: No active disease. Electronically Signed   By: Kerby Moors M.D.   On: 11/12/2022 15:44    Procedures Procedures    Medications Ordered in ED Medications  sodium chloride 0.9 % bolus 1,000 mL (1,000 mLs Intravenous Not Given 11/12/22 2043)  LORazepam (ATIVAN) injection 2 mg (2 mg Intramuscular Given 11/12/22 1700)    ED Course/ Medical Decision Making/ A&P    Patient seen and examined. History obtained directly from patient, EMS report, and previous ED visit.   Labs/EKG: Ordered CBC, CMP, alcohol, acetaminophen level, salicylate level.  Patient is tachycardic and does have a fever on arrival.  This may be related to drug use, but will consider infection.  Lactate ordered.  Imaging: Ordered chest x-ray.  Medications/Fluids: Ordered: IV Ativan, IV fluid bolus.  Most recent vital signs reviewed and are as follows: BP (!) 141/107   Pulse (!) 115   Temp (!) 101.3 F (38.5 C)   Resp 17   Ht 5\' 8"  (1.727 m)   Wt 61.2 kg   SpO2 98%   BMI 20.53 kg/m   Initial impression: Suspect drug intoxication, status post Narcan.  4:38 PM Reassessment performed. Called to bedside by RN. Pt standing at bedside, crying.  He has not yet received medications ordered on arrival.  It appears he may have dislodged his IV.  I helped the patient back in the bed.  RN not at nurses station, messaged asking to provide medications.  Plan: Labs, meds.   11:17 PM Reassessment performed several times in interim.  Patient has been sleeping after administration of Ativan.  Labs personally reviewed and interpreted including: CBC with white blood cell count 11.5, hemoglobin 11.4 otherwise unremarkable; CMP sodium 132, creatinine normal; lactate 1.2; ethanol less than 10, undetectable salicylate and acetaminophen.  Imaging personally visualized and  interpreted including: Chest x-ray agree negative.  Reviewed pertinent lab work and imaging with patient at bedside. Questions answered.   Most current vital signs reviewed and are as follows: BP (!) 131/90   Pulse (!) 115   Temp (!) 101.3 F (38.5 C)   Resp 17   Ht 5\' 8"  (1.727 m)   Wt 61.2 kg   SpO2 98%   BMI 20.53 kg/m   Plan: Continue metabolize. If patient is clinically sober, recent IVC. He will need to have repeat set of vitals.  Signout to [employer] at shift change.                            Medical Decision Making Amount and/or Complexity of Data Reviewed Labs: ordered. Radiology: ordered.  Risk Prescription drug management.   Patient with substance abuse, suspect aloc 2/2 drug use.  Patient tachycardic with elevated temp on arrival.  Low concern for infection without source.  Considered meningitis, but no signs of meningismus.  Patient is oriented.  White blood cell count minimally elevated.  Lactate normal.        Final Clinical Impression(s) / ED Diagnoses Final diagnoses:  Polysubstance abuse [facility])    Rx / [place] Orders ED Discharge Orders     None         Carlisle Cater, PA-C 11/12/22 2329    Varney Biles, MD 11/13/22 2345

## 2022-11-12 NOTE — ED Notes (Signed)
IVC 11/12/2022 Expires 11/19/2022 Copies to Medical Records, Faxed to [facility], 3 copies made placed in orange zone clipboard, inform DR, RN.

## 2022-11-13 ENCOUNTER — Emergency Department (HOSPITAL_COMMUNITY): Payer: Self-pay

## 2022-11-13 ENCOUNTER — Other Ambulatory Visit (HOSPITAL_COMMUNITY): Payer: Self-pay

## 2022-11-13 DIAGNOSIS — Z5900 Homelessness unspecified: Secondary | ICD-10-CM

## 2022-11-13 DIAGNOSIS — T50901A Poisoning by unspecified drugs, medicaments and biological substances, accidental (unintentional), initial encounter: Secondary | ICD-10-CM | POA: Insufficient documentation

## 2022-11-13 DIAGNOSIS — F1110 Opioid abuse, uncomplicated: Secondary | ICD-10-CM | POA: Insufficient documentation

## 2022-11-13 LAB — RESP PANEL BY RT-PCR (RSV, FLU A&B, COVID)  RVPGX2
Influenza A by PCR: NEGATIVE
Influenza B by PCR: NEGATIVE
Resp Syncytial Virus by PCR: NEGATIVE
SARS Coronavirus 2 by RT PCR: NEGATIVE

## 2022-11-13 LAB — URINALYSIS, ROUTINE W REFLEX MICROSCOPIC
Bilirubin Urine: NEGATIVE
Glucose, UA: NEGATIVE mg/dL
Hgb urine dipstick: NEGATIVE
Ketones, ur: NEGATIVE mg/dL
Leukocytes,Ua: NEGATIVE
Nitrite: NEGATIVE
Protein, ur: NEGATIVE mg/dL
Specific Gravity, Urine: 1.016 (ref 1.005–1.030)
pH: 6 (ref 5.0–8.0)

## 2022-11-13 LAB — SEDIMENTATION RATE: Sed Rate: 20 mm/hr — ABNORMAL HIGH (ref 0–16)

## 2022-11-13 LAB — C-REACTIVE PROTEIN: CRP: 4.3 mg/dL — ABNORMAL HIGH (ref <1.0)

## 2022-11-13 MED ORDER — GABAPENTIN 100 MG PO CAPS
200.0000 mg | ORAL_CAPSULE | Freq: Two times a day (BID) | ORAL | Status: DC
  Administered 2022-11-13: 200 mg via ORAL
  Filled 2022-11-13: qty 2

## 2022-11-13 MED ORDER — DOXYCYCLINE HYCLATE 100 MG PO TABS
100.0000 mg | ORAL_TABLET | Freq: Two times a day (BID) | ORAL | 0 refills | Status: AC
  Filled 2022-11-13: qty 20, 10d supply, fill #0

## 2022-11-13 MED ORDER — DOXYCYCLINE HYCLATE 100 MG PO CAPS: 100.0000 mg | ORAL_CAPSULE | Freq: Two times a day (BID) | ORAL | 0 refills | Status: AC

## 2022-11-13 NOTE — ED Notes (Signed)
ED Provider at bedside. 

## 2022-11-13 NOTE — ED Notes (Signed)
Pt provided with anti-bx at time of DC.

## 2022-11-13 NOTE — ED Notes (Signed)
PT to xray

## 2022-11-13 NOTE — ED Notes (Signed)
TTS done 

## 2022-11-13 NOTE — ED Notes (Signed)
Pt changed to purple scrubs.

## 2022-11-13 NOTE — Discharge Instructions (Signed)
Take the antibiotics as prescribed and perform warm soaks of the hand area twice daily.  You should return to the ED for recheck of your wound in 2 days to ensure that it is healing well and to ensure that your blood cultures are not growing any bacteria.  If you cannot see the hand doctor you should return to the ED for recheck.  Stop using drugs.

## 2022-11-13 NOTE — Progress Notes (Addendum)
Pt has been psych cleared per Merlyn Lot, NP. This CSW added resources to pt's AVS. TOC to assist with any other discharge needs. CSW will remove from [facility] shift report.    Benjaman Kindler, MSW, LCSWA 11/13/2022 11:21 AM

## 2022-11-13 NOTE — ED Notes (Signed)
Pt refuses to answer staff . Pt nods head.

## 2022-11-13 NOTE — ED Notes (Signed)
IVC status verified 

## 2022-11-13 NOTE — ED Provider Notes (Signed)
Emergency Medicine Observation Re-evaluation Note  Richard Everett is a 37 y.o. male, seen on rounds today.  Pt initially presented to the ED for complaints of Drug Overdose Currently, the patient is sleeping.  Physical Exam  BP (!) 135/92 (BP Location: Left Arm)   Pulse (!) 101   Temp 99.2 F (37.3 C) (Oral)   Resp 20   Ht 5\' 8"  (1.727 m)   Wt 61.2 kg   SpO2 97%   BMI 20.53 kg/m  Physical Exam General: No distress Cardiac: Mild tachycardia, no murmur Lungs: Clear lungs Psych: Calm  Disheveled.  There is extensive soft tissue swelling to distal phalanx of left middle finger with overlying tenderness and reduced range of motion.  No fluctuance. Appears to be bulla ED Course / MDM  EKG:EKG Interpretation  Date/Time:  Wednesday November 12 2022 15:11:01 EST Ventricular Rate:  103 PR Interval:  123 QRS Duration: 90 QT Interval:  332 QTC Calculation: 435 R Axis:   81 Text Interpretation: Sinus tachycardia RSR' in V1 or V2, right VCD or RVH Consider left ventricular hypertrophy No acute changes No significant change since last tracing Confirmed by Varney Biles [phone]) on 11/12/2022 7:38:08 PM  I have reviewed the labs performed to date as well as medications administered while in observation.  Recent changes in the last 24 hours include evaluated by psychiatry who is cleared patient for discharge.  Plan  Unclear source of patient's fever and tachycardia.  He does have leukocytosis of 11.  He is an IV drug user.  He has no murmurs on exam to suggest possibility of endocarditis.  Chest x-ray is negative, COVID and flu swabs are negative.  Blood cultures will be obtained.   Finger wound as above was concerning for likely infection and abscess.  Possible paronychia that has progressed.  This is likely the source of his fever. He was agreeable to needle aspiration which drained copious amounts of purulent material.  Blood cultures are sent.  Patient is homeless and does not have  a phone.  Advised he should return to the ED in 2 days for wound check and to follow-up with blood cultures.  He is not suicidal or homicidal.  Advised warm soaks, antibiotics, follow-up for wound check with the ED or hand surgery.  Return to the ED with new or worsening symptoms.  Marland Kitchen.Incision and Drainage  Date/Time: 11/13/2022 1:57 PM  Performed by: Ezequiel Essex, MD Authorized by: Ezequiel Essex, MD   Consent:    Consent obtained:  Verbal   Consent given by:  Patient   Risks, benefits, and alternatives were discussed: yes     Risks discussed:  Bleeding, damage to other organs, incomplete drainage, infection and pain   Alternatives discussed:  No treatment Universal protocol:    Procedure explained and questions answered to patient or proxy's satisfaction: yes     Relevant documents present and verified: yes     Test results available : yes     Imaging studies available: yes     Required blood products, implants, devices, and special equipment available: yes     Site/side marked: yes     Immediately prior to procedure, a time out was called: yes     Patient identity confirmed:  Verbally with patient Location:    Type:  Abscess   Size:  2   Location:  Upper extremity   Upper extremity location:  Finger   Finger location:  L long finger Pre-procedure details:    Skin preparation:  Chlorhexidine Sedation:    Sedation type:  None Anesthesia:    Anesthesia method:  None Procedure type:    Complexity:  Simple Procedure details:    Needle aspiration: yes     Needle size:  18 G   Wound management:  Probed and deloculated and irrigated with saline   Drainage:  Purulent   Drainage amount:  Copious   Wound treatment:  Wound left open   Packing materials:  None Post-procedure details:    Procedure completion:  Mignon Pine, MD 11/13/22 1448

## 2022-11-13 NOTE — Consult Note (Signed)
Telepsych Consultation   Reason for Consult:  Tele-psychiatry Assessment Referring Physician:  Kathryne Hitch  Location of Patient:    [facility]  Location of Provider: Other: virtual home office  Patient Identification: Richard Everett MRN:  [id number] Principal Diagnosis: Accidental overdose Diagnosis:  Principal Problem:   Accidental overdose Active Problems:   Opiate abuse, continuous ([place])   Homelessness   Total Time spent with patient: 30 minutes  Subjective:   Richard Everett is a 37 y.o. male patient admitted with drug overdose.  HPI:   Patient seen via telepsych by this provider; chart reviewed and consulted with Dr. Dwyane Dee on 11/13/22.  On evaluation Richard Everett reports "I overdosed on drugs."  Pt is see laying upright on hospital bed.  He is alert and oriented, states his name, aware he's in the hospital but not sure which hospital.  Pt reports he was not trying to kill himself but reports he uses fentanyl daily and accidentally took too much.  PT reports daily IV drug usage for the past year.  Reports various attempts at soberiety but it was on his own and he did not go to a rehab.  Prior to arrival, he reports he was trying to detox on his own and reports, "It's going well."  At present, pt appears tired and requires a few verbal prompts to remain awake and participate.    Pt reports he relocated from [place] to [place] for work, but currently is unemployed. He denies family or friends locally whom he can call on for support.   Pt did not submit for UDS but reports daily fentanyl use; he denies use of alcohol, marijuana, opioids or other illicit drugs today.  His Na level was low at 132 (normal is 135-162mmol/L); Deferred to admitting ED provider.  WBC are elevated at 11.5 but this could be due to stress.  RBC, H&H are low but they were also low when last blood draw 04/11/2022.  Covid, Flu and RSV panels are negative; EKG, QT-QTC intervals is 332-435.  He  denies nausea, vomiting but does report a hand tremor and requests something to help with this.  He denies hx of complicated withdrawal symptoms.   During evaluation Richard Everett is sitting upright on hospital gurney; he is fairly groomed in hospital scrubs; hair is overgrown; he is alert/oriented x 3;  appears sleepy but cooperative; and mood congruent with affect.  Patient is speaking in a clear tone at moderate volume, and normal pace; with minimal eye contact.  His thought process is coherent; There is no indication that he is currently responding to internal/external stimuli or experiencing delusional thought content.  Patient denies suicidal/self-harm/homicidal ideation, psychosis, and paranoia.  Patient has remained cooperative throughout assessment and has answered questions appropriately.   Per ED Provider Admission Assessment 11/12/2021: hief Complaint  Patient presents with   Drug Overdose      Richard Everett is a 37 y.o. male.   Patient with history of homelessness, polysubstance abuse --presents by EMS today from a homeless encampment.  Per EMS report, patient was given 3 doses of Narcan prior to EMS arrival.  Patient awake, mumbling speech with EMS.  No further medications.  IV initiated.  Patient states that he was using fentanyl.      Past Psychiatric History: fentanyl abuse;   Risk to Self:  denies Risk to Others:  denies Prior Inpatient Therapy:  denies Prior Outpatient Therapy:  denies  Past Medical History: No past medical history on  file. No past surgical history on file. Family History: No family history on file. Family Psychiatric  History: pt denies Social History:  Social History   Substance and Sexual Activity  Alcohol Use Not on file     Social History   Substance and Sexual Activity  Drug Use Not on file    Social History   Socioeconomic History   Marital status: Single    Spouse name: Not on file   Number of children: Not on file   Years of  education: Not on file   Highest education level: Not on file  Occupational History   Not on file  Tobacco Use   Smoking status: Not on file   Smokeless tobacco: Not on file  Substance and Sexual Activity   Alcohol use: Not on file   Drug use: Not on file   Sexual activity: Not on file  Other Topics Concern   Not on file  Social History Narrative   Not on file   Social Determinants of Health   Financial Resource Strain: Not on file  Food Insecurity: Not on file  Transportation Needs: Not on file  Physical Activity: Not on file  Stress: Not on file  Social Connections: Not on file   Additional Social History:    Allergies:  No Known Allergies  Labs:  Results for orders placed or performed during the hospital encounter of 11/12/22 (from the past 48 hour(s))  CBC with Differential     Status: Abnormal   Collection Time: 11/12/22  4:12 PM  Result Value Ref Range   WBC 11.5 (H) 4.0 - 10.5 K/uL   RBC 3.78 (L) 4.22 - 5.81 MIL/uL   Hemoglobin 11.4 (L) 13.0 - 17.0 g/dL   HCT 33.0 (L) 39.0 - 52.0 %   MCV 87.3 80.0 - 100.0 fL   MCH 30.2 26.0 - 34.0 pg   MCHC 34.5 30.0 - 36.0 g/dL   RDW 13.2 11.5 - 15.5 %   Platelets 241 150 - 400 K/uL   nRBC 0.0 0.0 - 0.2 %   Neutrophils Relative % 83 %   Neutro Abs 9.4 (H) 1.7 - 7.7 K/uL   Lymphocytes Relative 6 %   Lymphs Abs 0.7 0.7 - 4.0 K/uL   Monocytes Relative 10 %   Monocytes Absolute 1.2 (H) 0.1 - 1.0 K/uL   Eosinophils Relative 1 %   Eosinophils Absolute 0.1 0.0 - 0.5 K/uL   Basophils Relative 0 %   Basophils Absolute 0.0 0.0 - 0.1 K/uL   Immature Granulocytes 0 %   Abs Immature Granulocytes 0.05 0.00 - 0.07 K/uL    Comment: Performed at [facility], [address], [place], [place] [address]  Comprehensive metabolic panel     Status: Abnormal   Collection Time: 11/12/22  4:12 PM  Result Value Ref Range   Sodium 132 (L) 135 - 145 mmol/L   Potassium 4.2 3.5 - 5.1 mmol/L   Chloride 103 98 - 111 mmol/L   CO2 20 (L)  22 - 32 mmol/L   Glucose, Bld 98 70 - 99 mg/dL    Comment: Glucose reference range applies only to samples taken after fasting for at least 8 hours.   BUN 15 6 - 20 mg/dL   Creatinine, Ser 0.88 0.61 - 1.24 mg/dL   Calcium 8.0 (L) 8.9 - 10.3 mg/dL   Total Protein 6.5 6.5 - 8.1 g/dL   Albumin 3.6 3.5 - 5.0 g/dL   AST 38 15 -  41 U/L   ALT 33 0 - 44 U/L   Alkaline Phosphatase 52 38 - 126 U/L   Total Bilirubin 1.3 (H) 0.3 - 1.2 mg/dL   GFR, Estimated >60 >60 mL/min    Comment: (NOTE) Calculated using the CKD-EPI Creatinine Equation (2021)    Anion gap 9 5 - 15    Comment: Performed at [facility] [address], [place], [place] [address]  Lactic acid, plasma     Status: None   Collection Time: 11/12/22  4:12 PM  Result Value Ref Range   Lactic Acid, Venous 1.2 0.5 - 1.9 mmol/L    Comment: Performed at [facility] [address], [place], [place] [address]  Ethanol     Status: None   Collection Time: 11/12/22  4:12 PM  Result Value Ref Range   Alcohol, Ethyl (B) <10 <10 mg/dL    Comment: (NOTE) Lowest detectable limit for serum alcohol is 10 mg/dL.  For medical purposes only. Performed at [facility], [place] [address], [place], [place] [id number]   Salicylate level     Status: Abnormal   Collection Time: 11/12/22  4:12 PM  Result Value Ref Range   Salicylate Lvl Q000111Q (L) 7.0 - 30.0 mg/dL    Comment: Performed at [facility] [address] [place], [place] [address]  Acetaminophen level     Status: Abnormal   Collection Time: 11/12/22  4:12 PM  Result Value Ref Range   Acetaminophen (Tylenol), Serum <10 (L) 10 - 30 ug/mL    Comment: (NOTE) Therapeutic concentrations vary significantly. A range of 10-30 ug/mL  may be an effective concentration for many patients. However, some  are best treated at concentrations outside of this range. Acetaminophen concentrations >150 ug/mL at 4 hours after ingestion  and >50 ug/mL at 12 hours after ingestion are  often associated with  toxic reactions.  Performed at [facility], [place] [address] [place], [place] [address]   Resp panel by RT-PCR (RSV, Flu A&B, Covid) Anterior Nasal Swab     Status: None   Collection Time: 11/13/22  3:51 AM   Specimen: Anterior Nasal Swab  Result Value Ref Range   SARS Coronavirus 2 by RT PCR NEGATIVE NEGATIVE    Comment: (NOTE) SARS-CoV-2 target nucleic acids are NOT DETECTED.  The SARS-CoV-2 RNA is generally detectable in upper respiratory specimens during the acute phase of infection. The lowest concentration of SARS-CoV-2 viral copies this assay can detect is 138 copies/mL. A negative result does not preclude SARS-Cov-2 infection and should not be used as the sole basis for treatment or other patient management decisions. A negative result may occur with  improper specimen collection/handling, submission of specimen other than nasopharyngeal swab, presence of viral mutation(s) within the areas targeted by this assay, and inadequate number of viral copies(<138 copies/mL). A negative result must be combined with clinical observations, patient history, and epidemiological information. The expected result is Negative.  Fact Sheet for Patients:  EntrepreneurPulse.com.au  Fact Sheet for Healthcare Providers:  IncredibleEmployment.be  This test is no t yet approved or cleared by the [place] FDA and  has been authorized for detection and/or diagnosis of SARS-CoV-2 by FDA under an Emergency Use Authorization (EUA). This EUA will remain  in effect (meaning this test can be used) for the duration of the COVID-19 declaration under Section 564(b)(1) of the Act, 21 U.S.C.section 360bbb-3(b)(1), unless the authorization is terminated  or revoked sooner.  Influenza A by PCR NEGATIVE NEGATIVE   Influenza B by PCR NEGATIVE NEGATIVE    Comment: (NOTE) The Xpert Xpress SARS-CoV-2/FLU/RSV plus assay is intended as  an aid in the diagnosis of influenza from Nasopharyngeal swab specimens and should not be used as a sole basis for treatment. Nasal washings and aspirates are unacceptable for Xpert Xpress SARS-CoV-2/FLU/RSV testing.  Fact Sheet for Patients: EntrepreneurPulse.com.au  Fact Sheet for Healthcare Providers: IncredibleEmployment.be  This test is not yet approved or cleared by the [place] FDA and has been authorized for detection and/or diagnosis of SARS-CoV-2 by FDA under an Emergency Use Authorization (EUA). This EUA will remain in effect (meaning this test can be used) for the duration of the COVID-19 declaration under Section 564(b)(1) of the Act, 21 U.S.C. section 360bbb-3(b)(1), unless the authorization is terminated or revoked.     Resp Syncytial Virus by PCR NEGATIVE NEGATIVE    Comment: (NOTE) Fact Sheet for Patients: EntrepreneurPulse.com.au  Fact Sheet for Healthcare Providers: IncredibleEmployment.be  This test is not yet approved or cleared by the [place] FDA and has been authorized for detection and/or diagnosis of SARS-CoV-2 by FDA under an Emergency Use Authorization (EUA). This EUA will remain in effect (meaning this test can be used) for the duration of the COVID-19 declaration under Section 564(b)(1) of the Act, 21 U.S.C. section 360bbb-3(b)(1), unless the authorization is terminated or revoked.  Performed at [facility], [place] [address] [place], [place] [address]     Medications:  Current Facility-Administered Medications  Medication Dose Route Frequency Provider Last Rate Last Admin   gabapentin (NEURONTIN) capsule 200 mg  200 mg Oral BID Merlyn Lot E, NP   200 mg at 11/13/22 1333   sodium chloride 0.9 % bolus 1,000 mL  1,000 mL Intravenous Once Carlisle Cater, PA-C       No current outpatient medications on file.    Musculoskeletal: pt moves all extremities  and ambulates independently.  Strength & Muscle Tone: within normal limits Gait & Station: normal Patient leans: N/A Psychiatric Specialty Exam:  Presentation  General Appearance: Fairly Groomed  Eye Contact:Minimal  Speech:Clear and Coherent; Slow  Speech Volume:Normal  Handedness:Right   Mood and Affect  Mood:Dysphoric (appears tired)  Affect:Restricted   Thought Process  Thought Processes:Goal Directed  Descriptions of Associations:Intact  Orientation:Partial  Thought Content:Logical  History of Schizophrenia/Schizoaffective disorder:No data recorded Duration of Psychotic Symptoms:No data recorded Hallucinations:Hallucinations: None  Ideas of Reference:None  Suicidal Thoughts:Suicidal Thoughts: No  Homicidal Thoughts:Homicidal Thoughts: No   Sensorium  Memory:Immediate Fair; Recent Fair; Remote Fair  Judgment:-- (impulsive at baseline d/t illicit polysubstance usage)  Insight:Lacking (this is baseline for patient)   Executive Functions  Concentration:Fair  Attention Span:Fair  [place] of Knowledge:Good  Language:Good   Psychomotor Activity  Psychomotor Activity:Psychomotor Activity: Normal   Assets  Assets:Communication Skills   Sleep  Sleep:Sleep: Good Number of Hours of Sleep: 8    Physical Exam: Physical Exam Constitutional:      Appearance: Normal appearance.  Pulmonary:     Effort: Pulmonary effort is normal.  Musculoskeletal:        General: Normal range of motion.     Cervical back: Normal range of motion.  Neurological:     Mental Status: He is alert and oriented to person, place, and time. Mental status is at baseline.  Psychiatric:        Attention and Perception: Attention and perception normal.        Mood and  Affect: Mood normal. Affect is blunt.        Speech: Speech normal.        Behavior: Behavior is slowed. Behavior is cooperative.        Thought Content: Thought content normal. Thought  content is not delusional. Thought content does not include suicidal ideation. Thought content does not include homicidal or suicidal plan.        Cognition and Memory: Cognition normal.        Judgment: Judgment is impulsive.    Review of Systems  Constitutional: Negative.   HENT: Negative.    Eyes: Negative.   Respiratory: Negative.    Cardiovascular: Negative.   Gastrointestinal: Negative.   Genitourinary: Negative.   Skin: Negative.   Neurological: Negative.   Endo/Heme/Allergies: Negative.   Psychiatric/Behavioral:  Positive for substance abuse.    Blood pressure (!) 135/92, pulse (!) 101, temperature 99.2 F (37.3 C), temperature source Oral, resp. rate 20, height 5\' 8"  (1.727 m), weight 61.2 kg, SpO2 97 %. Body mass index is 20.53 kg/m.  Treatment Plan Summary: Pt care discussed and reviewed with Dr, Dwyane Dee.  Patient who presents after accidental overdose on fentanyl.  He reports daily fentanyl usage, denies suicidal or homicidal ideations, no AVH. He is alert and oriented, and able to appropriately verbalize his needs.  During his stay, he received ativan to help with withdrawal symptoms and a one time does of gabapentin 200mg  po for anxiety associated with withdrawal-tolerated well.  Sleep and appetite are both good.  He's offered referral to [facility] for drug rehab but declined.  Also offered homeless resources of which he declined.   As per above, patient does not meet criteria for involuntary psychiatric admissions and states he is not interested in voluntary admissions.  He is psych cleared ; I have asked SW to add drug rehab, mental health and homeless resources to his discharge AVS and recommended he follow up with outpatient resources.     We reviewed the importance of substance abuse abstinence; potential negative impact substance abuse can have on relationships and level of functioning.  He does not take mental health medications and declines starting  today.   Disposition: No evidence of imminent risk to self or others at present.   Patient does not meet criteria for psychiatric inpatient admission. Supportive therapy provided about ongoing stressors. Discussed crisis plan, support from social network, calling 911, coming to the Emergency Department, and calling Suicide Hotline.  This service was provided via telemedicine using a 2-way, interactive audio and video technology.  Names of all persons participating in this telemedicine service and their role in this encounter. Name: Richard Everett Role: Patient   Name: Bailey Mech Role: Nurse Tech   Name: Merlyn Lot Role: Anselmo  Name: Hampton Abbot Role: Psychiatrist    Mallie Darting, NP 11/13/2022 1:59 PM

## 2022-11-13 NOTE — ED Notes (Signed)
Sitter reported Pt showed him a finger that was possible injured. This writer went in Pt room to look at finger. Pt's eyes were closed and Pt did not answer when asked to look at finger

## 2022-11-13 NOTE — ED Notes (Signed)
Pt is drinking fluids ,A/O and ambulatory.

## 2022-11-13 NOTE — ED Provider Notes (Signed)
Assumed care at shift change.  See prior notes for full H&P.  Briefly, 37 y.o. M here after OD from homeless camp.  Apparently has been using fentanyl.  Unclear if SI or not.  Not really able to give much history here.  Also found to be febrile and tachy upon arrival.  Plan:  labs pending.  Tylenol given for fever.  Will reassess.  Results for orders placed or performed during the hospital encounter of 11/12/22  CBC with Differential  Result Value Ref Range   WBC 11.5 (H) 4.0 - 10.5 K/uL   RBC 3.78 (L) 4.22 - 5.81 MIL/uL   Hemoglobin 11.4 (L) 13.0 - 17.0 g/dL   HCT 33.0 (L) 39.0 - 52.0 %   MCV 87.3 80.0 - 100.0 fL   MCH 30.2 26.0 - 34.0 pg   MCHC 34.5 30.0 - 36.0 g/dL   RDW 13.2 11.5 - 15.5 %   Platelets 241 150 - 400 K/uL   nRBC 0.0 0.0 - 0.2 %   Neutrophils Relative % 83 %   Neutro Abs 9.4 (H) 1.7 - 7.7 K/uL   Lymphocytes Relative 6 %   Lymphs Abs 0.7 0.7 - 4.0 K/uL   Monocytes Relative 10 %   Monocytes Absolute 1.2 (H) 0.1 - 1.0 K/uL   Eosinophils Relative 1 %   Eosinophils Absolute 0.1 0.0 - 0.5 K/uL   Basophils Relative 0 %   Basophils Absolute 0.0 0.0 - 0.1 K/uL   Immature Granulocytes 0 %   Abs Immature Granulocytes 0.05 0.00 - 0.07 K/uL  Comprehensive metabolic panel  Result Value Ref Range   Sodium 132 (L) 135 - 145 mmol/L   Potassium 4.2 3.5 - 5.1 mmol/L   Chloride 103 98 - 111 mmol/L   CO2 20 (L) 22 - 32 mmol/L   Glucose, Bld 98 70 - 99 mg/dL   BUN 15 6 - 20 mg/dL   Creatinine, Ser 0.88 0.61 - 1.24 mg/dL   Calcium 8.0 (L) 8.9 - 10.3 mg/dL   Total Protein 6.5 6.5 - 8.1 g/dL   Albumin 3.6 3.5 - 5.0 g/dL   AST 38 15 - 41 U/L   ALT 33 0 - 44 U/L   Alkaline Phosphatase 52 38 - 126 U/L   Total Bilirubin 1.3 (H) 0.3 - 1.2 mg/dL   GFR, Estimated >60 >60 mL/min   Anion gap 9 5 - 15  Lactic acid, plasma  Result Value Ref Range   Lactic Acid, Venous 1.2 0.5 - 1.9 mmol/L  Ethanol  Result Value Ref Range   Alcohol, Ethyl (B) <84 <69 mg/dL  Salicylate level  Result  Value Ref Range   Salicylate Lvl <6.2 (L) 7.0 - 30.0 mg/dL  Acetaminophen level  Result Value Ref Range   Acetaminophen (Tylenol), Serum <10 (L) 10 - 30 ug/mL   DG Chest Portable 1 View  Result Date: 11/12/2022 CLINICAL DATA:  Fever.  Substance abuse. EXAM: PORTABLE CHEST 1 VIEW COMPARISON:  04/11/2022 FINDINGS: Heart size and mediastinal contours are unremarkable. There is no pleural effusion or edema. No airspace opacities. Visualized osseous structures are unremarkable. IMPRESSION: No active disease. Electronically Signed   By: Kerby Moors M.D.   On: 11/12/2022 15:44    Labs as above-- WBC count 11.5, normal lactate.  CXR clear.  Negative APAP, salicylate, ethanol levels.  RVP pending along with UA/UDS.  Still does not really give much history in regards to SI.  He continues just mumbling at this point.  He was  able to ambulate and change into purple scrubs, he ate half a sandwich and drank some fluids.  Will get TTS to evaluate to ensure his safety.   Larene Pickett, PA-C 11/13/22 [id number]    Quintella Reichert, MD 11/15/22 [phone]

## 2022-11-13 NOTE — ED Notes (Signed)
Pt is no longer ivc rescind paperwork done copy made original in the red folder

## 2022-11-13 NOTE — ED Notes (Signed)
DR Rancor notifed of possible finger injury

## 2022-11-18 LAB — CULTURE, BLOOD (ROUTINE X 2)
Culture: NO GROWTH
Culture: NO GROWTH
Special Requests: ADEQUATE

## 2023-04-10 ENCOUNTER — Other Ambulatory Visit: Payer: Self-pay

## 2023-04-10 ENCOUNTER — Emergency Department (HOSPITAL_COMMUNITY)
Admission: EM | Admit: 2023-04-10 | Discharge: 2023-04-11 | Disposition: A | Discharge status: Home / Self Care | Payer: Self-pay | Attending: Emergency Medicine | Admitting: Emergency Medicine

## 2023-04-10 ENCOUNTER — Encounter (HOSPITAL_COMMUNITY): Payer: Self-pay | Admitting: Emergency Medicine

## 2023-04-10 DIAGNOSIS — R4689 Other symptoms and signs involving appearance and behavior: Secondary | ICD-10-CM

## 2023-04-10 NOTE — ED Provider Notes (Signed)
[place] EMERGENCY DEPARTMENT AT [facility] Provider Note   CSN: [id number] Arrival date & time: 04/10/23  1911     History  Chief Complaint  Patient presents with   Medical Clearance    Kiryn Lersch is a 37 y.o. male presenting by EMS with concern for erratic behavior.  Per report provided by [employer] and EMS the patient was "seen dancing naked behind a [employer]".  Police were called for indecent exposure.  EMS brought the patient to the ED for "medical clearance".  On arrival the patient denies SI or HI.  He reports that he is thirsty and he wants water and he wants to sleep.  He has no complaints to me.  Reports prior history of IV drug use.  He will not provide any further history to me.  HPI     Home Medications Prior to Admission medications   Not on File      Allergies    Patient has no allergy information on record.    Review of Systems   Review of Systems  Physical Exam Updated Vital Signs BP 139/82 (BP Location: Right Arm)   Pulse 89   Temp 98.9 F (37.2 C) (Oral)   Resp 19   SpO2 98%  Physical Exam Constitutional:      General: He is not in acute distress. HENT:     Head: Normocephalic and atraumatic.  Eyes:     Conjunctiva/sclera: Conjunctivae normal.     Pupils: Pupils are equal, round, and reactive to light.  Cardiovascular:     Rate and Rhythm: Normal rate and regular rhythm.  Pulmonary:     Effort: Pulmonary effort is normal. No respiratory distress.  Abdominal:     General: There is no distension.     Tenderness: There is no abdominal tenderness.  Skin:    General: Skin is warm and dry.  Neurological:     General: No focal deficit present.     Mental Status: He is alert. Mental status is at baseline.     ED Results / Procedures / Treatments   Labs (all labs ordered are listed, but only abnormal results are displayed) Labs Reviewed - No data to display  EKG None  Radiology No results  found.  Procedures Procedures    Medications Ordered in ED Medications - No data to display  ED Course/ Medical Decision Making/ A&P Clinical Course as of 04/10/23 2341  Fri Apr 10, 2023  2229 Patient remains calm, asking for food and water. [MT]  2340 Patient is not willing to interact or speak to me further.  He is still sleeping comfortably.  We will monitor him for period longer in the ED, assess sobriety and stability on his feet, and I would anticipate discharge afterwards.   [MT]    Clinical Course User Index [MT] Atavia Poppe, Kermit Balo, MD                             Medical Decision Making  Patient is here with concern for erratic behavior in public.  Differential would include mental health disorder versus acute intoxication versus other.  His staples are vital on arrival.  Do not see any evidence of head trauma to warrant emergent neuroimaging.  The patient does not have SI or HI, and I did not see any clear indication on his initial presentation for involuntary commitment, or involuntary blood draws.  He is refusing blood  work at this time.  We will continue to monitor him for a period in the ED, for anticipated improved sobriety.         Final Clinical Impression(s) / ED Diagnoses Final diagnoses:  Change in behavior    Rx / DC Orders ED Discharge Orders     None         Terald Sleeper, MD 04/10/23 2341

## 2023-04-10 NOTE — ED Triage Notes (Signed)
  Patient BIB EMS for medical clearance.  EMS states patient was seen dancing naked behind the [employer] and [employer] was called.  Patient not expressing any SI/HI.  Hx IV drug use.  Patient uncooperative with EMS/triage.  States he just wants water and left alone.  No complaints of pain.

## 2023-04-11 NOTE — ED Provider Notes (Signed)
Patient was discharged hours ago by previous provider.  However patient has been sleeping in the ER all night.  He is easily arousable and in no acute distress.  He is now requesting water. He will now be discharged   Zadie Rhine, MD 04/11/23 0530

## 2023-04-11 NOTE — ED Notes (Signed)
Patient awake, alert and oriented. Asking for food. Informed him I would order him a hot meal and then he would be discharged.

## 2023-04-11 NOTE — ED Notes (Signed)
Assumed care of patient. Patient resting comfortably in bed with no signs of acute distress noted. Waiting on patient to be more awake before discharge.

## 2023-04-11 NOTE — ED Notes (Signed)
Patient's breakfast arrived and patient eating. Will discharge after he eats.

## 2023-04-11 NOTE — ED Notes (Signed)
Patient resting comfortably at this time.

## 2023-04-13 ENCOUNTER — Encounter (HOSPITAL_COMMUNITY): Payer: Self-pay | Admitting: Emergency Medicine

## 2023-04-16 ENCOUNTER — Encounter (HOSPITAL_COMMUNITY): Payer: Self-pay

## 2023-04-16 ENCOUNTER — Emergency Department (HOSPITAL_COMMUNITY)
Admission: EM | Admit: 2023-04-16 | Discharge: 2023-04-16 | Discharge status: Against Medical Advice | Payer: Self-pay | Attending: Emergency Medicine | Admitting: Emergency Medicine

## 2023-04-16 DIAGNOSIS — R799 Abnormal finding of blood chemistry, unspecified: Secondary | ICD-10-CM | POA: Insufficient documentation

## 2023-04-16 DIAGNOSIS — F1910 Other psychoactive substance abuse, uncomplicated: Secondary | ICD-10-CM | POA: Insufficient documentation

## 2023-04-16 LAB — I-STAT CHEM 8, ED
BUN: 16 mg/dL (ref 6–20)
Calcium, Ion: 1.09 mmol/L — ABNORMAL LOW (ref 1.15–1.40)
Chloride: 104 mmol/L (ref 98–111)
Creatinine, Ser: 0.7 mg/dL (ref 0.61–1.24)
Glucose, Bld: 94 mg/dL (ref 70–99)
HCT: 29 % — ABNORMAL LOW (ref 39.0–52.0)
Hemoglobin: 9.9 g/dL — ABNORMAL LOW (ref 13.0–17.0)
Potassium: 4.6 mmol/L (ref 3.5–5.1)
Sodium: 140 mmol/L (ref 135–145)
TCO2: 30 mmol/L (ref 22–32)

## 2023-04-16 LAB — CBG MONITORING, ED: Glucose-Capillary: 91 mg/dL (ref 70–99)

## 2023-04-16 MED ORDER — NALOXONE HCL 0.4 MG/ML IJ SOLN
0.4000 mg | Freq: Once | INTRAMUSCULAR | Status: AC
  Administered 2023-04-16: 0.4 mg via INTRAVENOUS
  Filled 2023-04-16: qty 1

## 2023-04-16 NOTE — ED Triage Notes (Signed)
BIB EMS found unresponsive [employer] bathroom floor. EMS did not give anything, no respiratory distress, EMS did not give any meds.

## 2023-04-16 NOTE — ED Notes (Signed)
Per EMS pt has unused needles in his backpack but searched by [employer] and no drugs were in his bag. Pt refused to leave a urine sample.

## 2023-04-16 NOTE — ED Notes (Signed)
Per

## 2023-04-16 NOTE — ED Provider Notes (Addendum)
[place] EMERGENCY DEPARTMENT AT [facility] Provider Note   CSN: [id number] Arrival date & time: 04/16/23  1419     History  Chief Complaint  Patient presents with  . Drug Problem    Richard Everett is a 37 y.o. male.   Drug Problem     Patient presents to the ED for evaluation of possible drug overdose.  Patient was found lying on the floor at [facility].  Police searched the patient's backpack and made did not find any drugs.  Patient was brought in by EMS.  Patient is very somnolent.  He continues to fall back asleep.  Patient admits to using some drugs possibly heroin, possibly methamphetamine.  He denies any intent to harm himself.  Patient asks if he has to be evaluated  Home Medications Prior to Admission medications   Medication Sig Start Date End Date Taking? Authorizing Provider  doxycycline (VIBRA-TABS) 100 MG tablet Take 1 tablet (100 mg total) by mouth 2 (two) times daily. 11/13/22   Rancour, Jeannett Senior, MD  doxycycline (VIBRAMYCIN) 100 MG capsule Take 1 capsule (100 mg total) by mouth 2 (two) times daily. 11/13/22   Glynn Octave, MD      Allergies    Patient has no known allergies.    Review of Systems   Review of Systems  Physical Exam Updated Vital Signs BP 134/78   Pulse 61   Temp 98.6 F (37 C) (Oral)   Resp 15   SpO2 99%  Physical Exam Vitals and nursing note reviewed.  Constitutional:      Appearance: He is well-developed. He is not diaphoretic.  HENT:     Head: Normocephalic and atraumatic.     Right Ear: External ear normal.     Left Ear: External ear normal.  Eyes:     General: No scleral icterus.       Right eye: No discharge.        Left eye: No discharge.     Conjunctiva/sclera: Conjunctivae normal.  Neck:     Trachea: No tracheal deviation.  Cardiovascular:     Rate and Rhythm: Normal rate.  Pulmonary:     Effort: Pulmonary effort is normal. No respiratory distress.     Breath sounds: No stridor.  Abdominal:      General: There is no distension.  Musculoskeletal:        General: No swelling or deformity.     Cervical back: Neck supple.  Skin:    General: Skin is warm and dry.     Findings: No rash.  Neurological:     Mental Status: He is lethargic.     Cranial Nerves: No facial asymmetry.     Motor: No weakness or seizure activity.     Comments: Mumbling speech pattern, patient will wake up and answer questions but the moment I stop speaking to him he immediately falls back asleep     ED Results / Procedures / Treatments   Labs (all labs ordered are listed, but only abnormal results are displayed) Labs Reviewed  I-STAT CHEM 8, ED - Abnormal; Notable for the following components:      Result Value   Calcium, Ion 1.09 (*)    Hemoglobin 9.9 (*)    HCT 29.0 (*)    All other components within normal limits  CBG MONITORING, ED    EKG None  Radiology No results found.  Procedures Procedures    Medications Ordered in ED Medications  naloxone (NARCAN) injection 0.4  mg (0.4 mg Intravenous Given 04/16/23 1454)    ED Course/ Medical Decision Making/ A&P Clinical Course as of 04/16/23 1508  Thu Apr 16, 2023  1508 I-STAT Chem-8 shows hemoglobin 9.9.  1 year ago was 10.1. [JK]    Clinical Course User Index [JK] Linwood Dibbles, MD                             Medical Decision Making Differential diagnosis includes drug use, hypoglycemia, metabolic derangement, low suspicion for trauma no signs of injury  Risk Prescription drug management.   Patient presented to ED for possible drug use.  Patient initially was very somnolent in the ED.  Patient was only able to answer few words.  He was not having any respiratory difficulty.  His vital signs were stable.  I had ordered labs and also ordered a dose of Narcan.  Following the Narcan the patient became alert and awake he is standing at the bedside.  Patient is unhappy that he was given heroin.  He states he did not want to come here.  He  wants to leave AMA.  Patient's presentation is consistent with opiate use.  He was not having any respiratory difficulty prior to his Narcan administration.  Recommended patient let us monitor him for a while so he does not have any recurrent symptoms.  Patient is adamant about leaving.  At this point I do not think he warrants holding him involuntarily.  Patient will be leaving AMA       Final Clinical Impression(s) / ED Diagnoses Final diagnoses:  Substance abuse [facility])    Rx / DC Orders ED Discharge Orders     None         Linwood Dibbles, MD 04/16/23 1507    Linwood Dibbles, MD 04/16/23 [phone]

## 2023-04-16 NOTE — ED Notes (Signed)
Refused to provide a urine specimen

## 2023-12-18 IMAGING — DX DG CHEST 1V PORT
1 series · 1 of 1 positions shown · non-contrast
Comparison: None Available.

CLINICAL DATA: Altered mental status.

EXAM:
PORTABLE CHEST 1 VIEW

[chest]
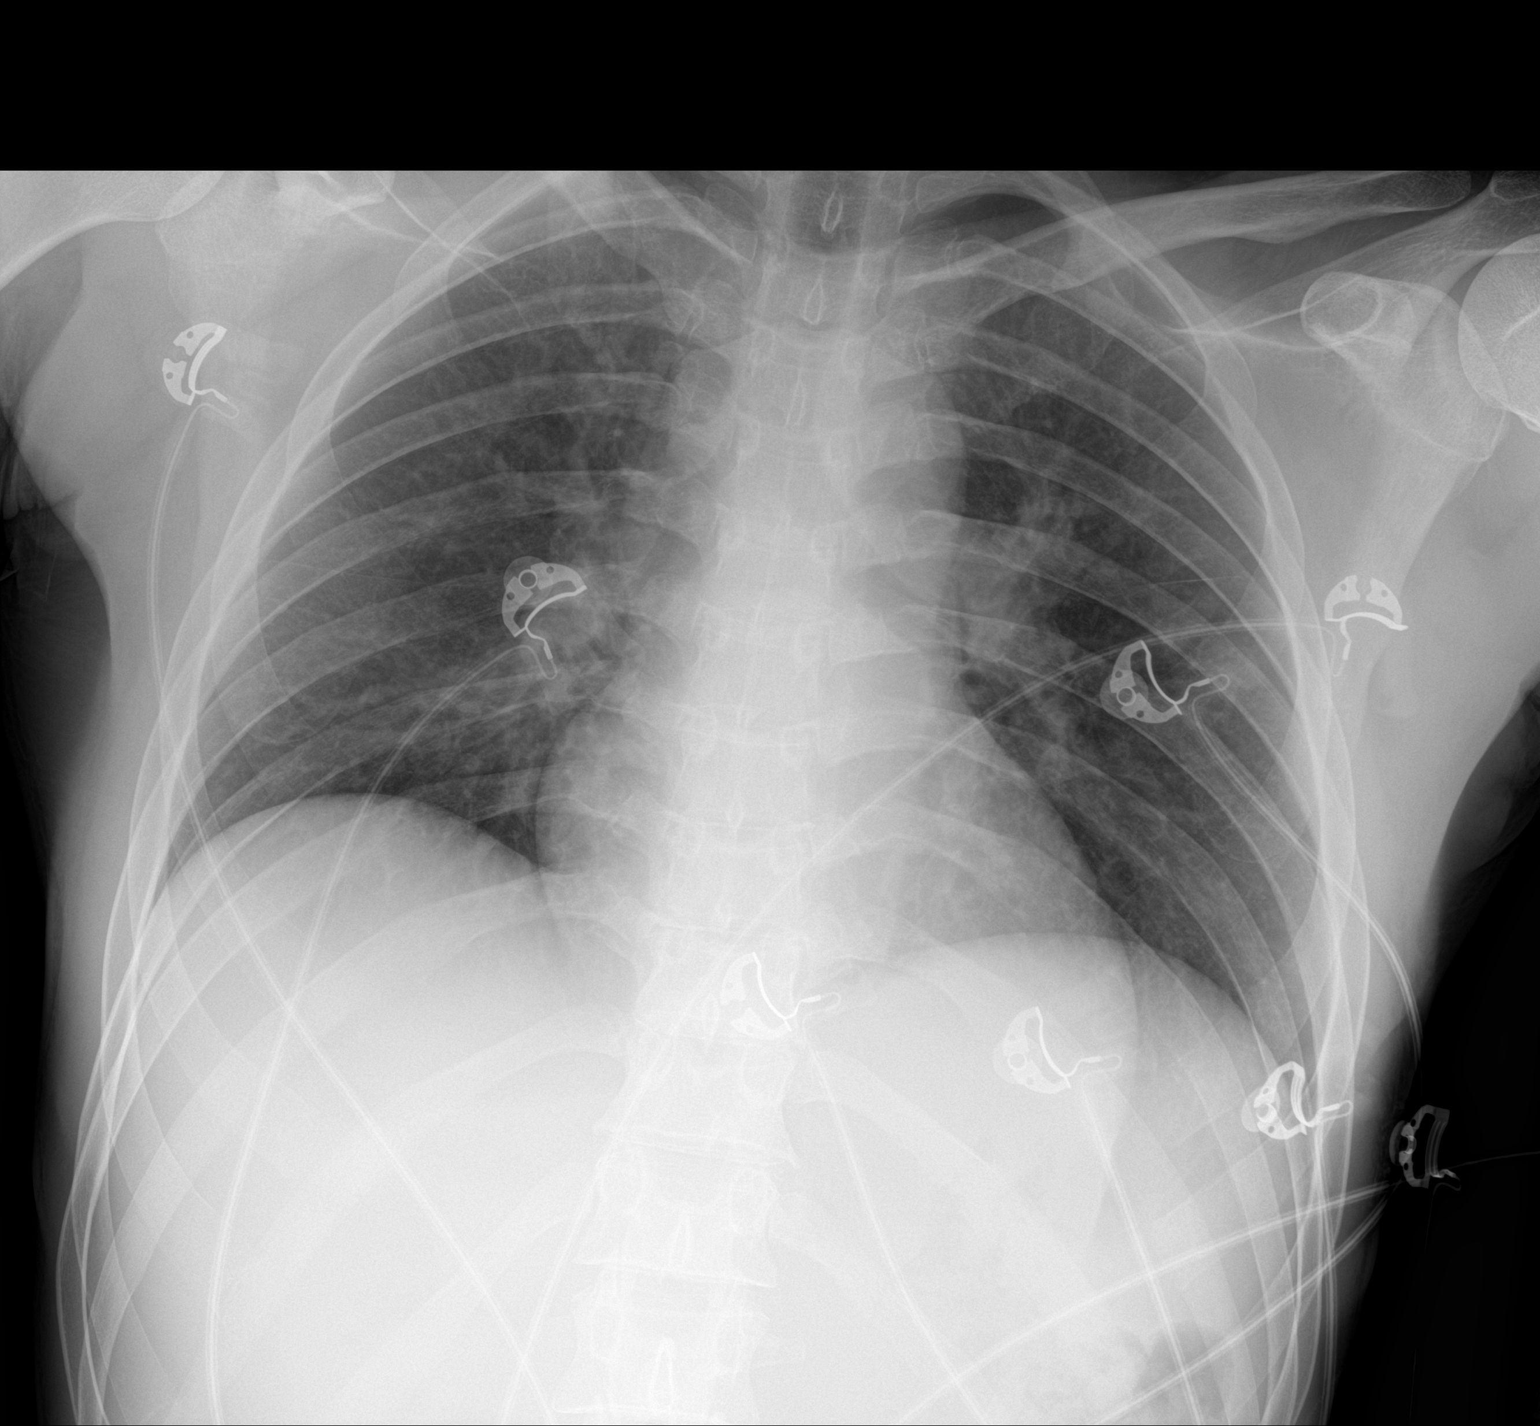

[1 of 1 positions shown; findings below may reference images not displayed]

FINDINGS: The heart size and mediastinal contours are within normal limits.
Both lungs are clear. The visualized skeletal structures are
unremarkable.
IMPRESSION: No active disease.

## 2023-12-18 IMAGING — CT CT CERVICAL SPINE W/O CM
3 of 4 series · 13 of 34 positions shown, 16 images · non-contrast
Comparison: None Available.

CLINICAL DATA: Disoriented



[Series 8: sag bone · sagittal · 0.30mm/px · 5 of 79 slices shown, 6 images]
[im 27/79  bone]
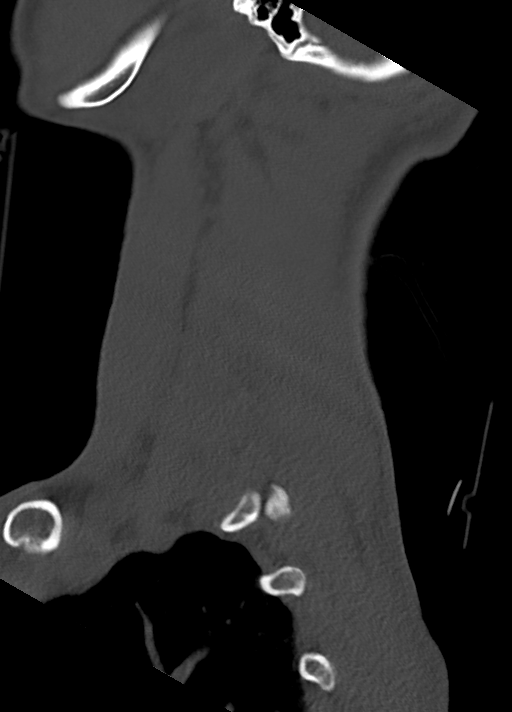
[im 33/79  bone]
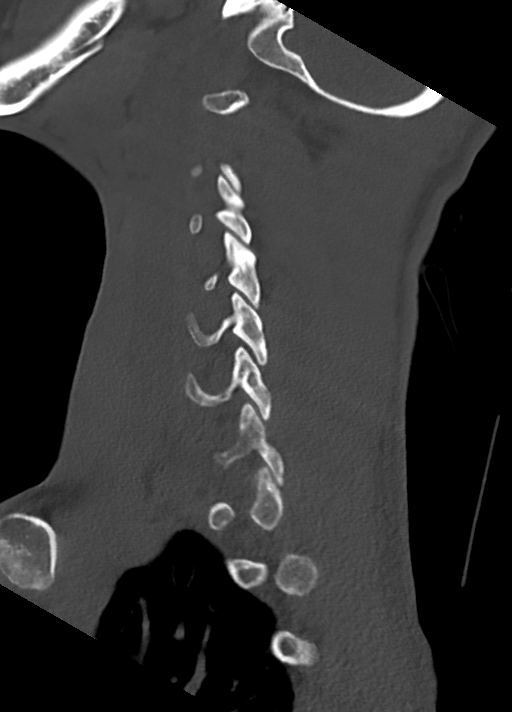
[im 40/79  soft-tissue]
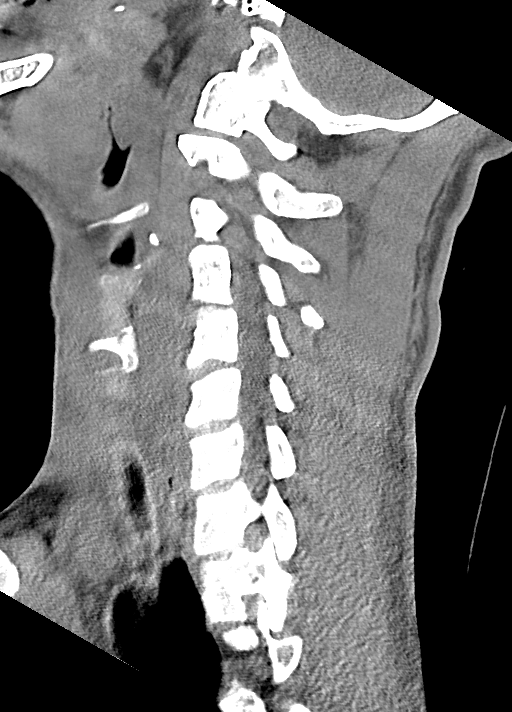
[im 40/79  bone]
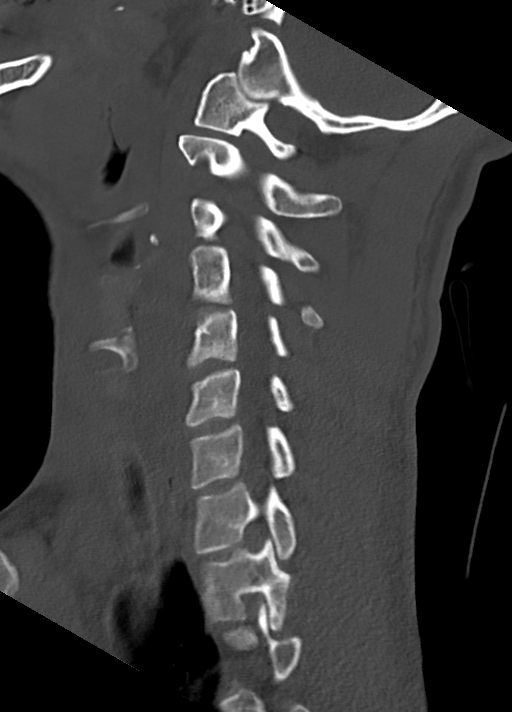
[im 46/79  bone]
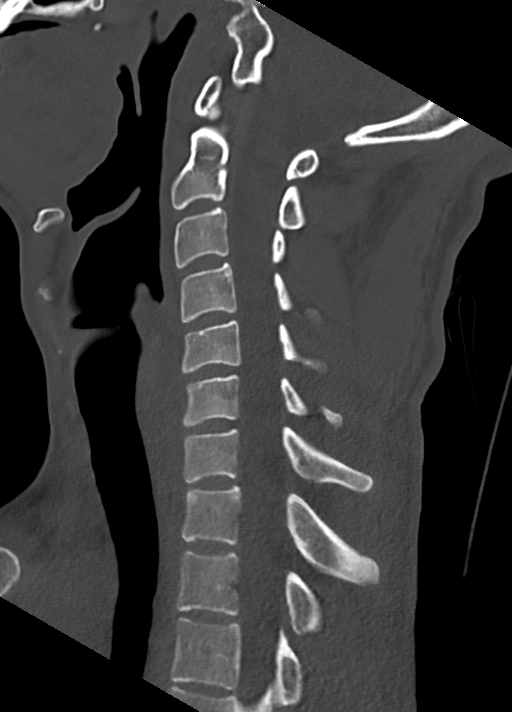
[im 53/79  bone]
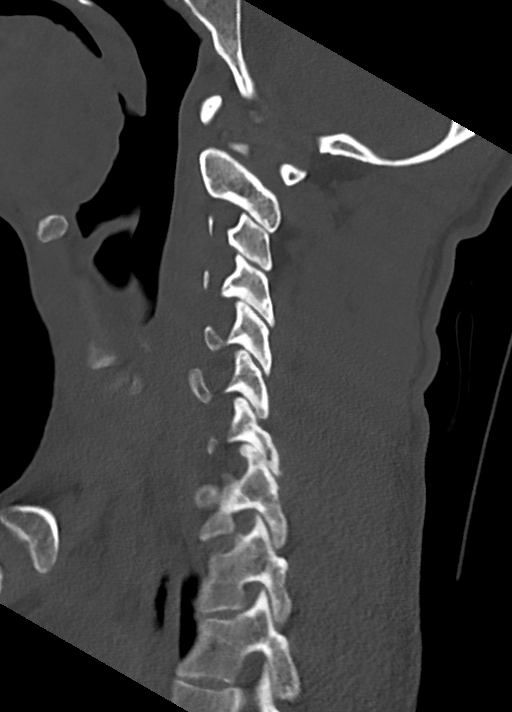

[Series 9: cor bone · coronal · 0.28mm/px · 3 of 69 slices shown]
[im 20/69  bone]
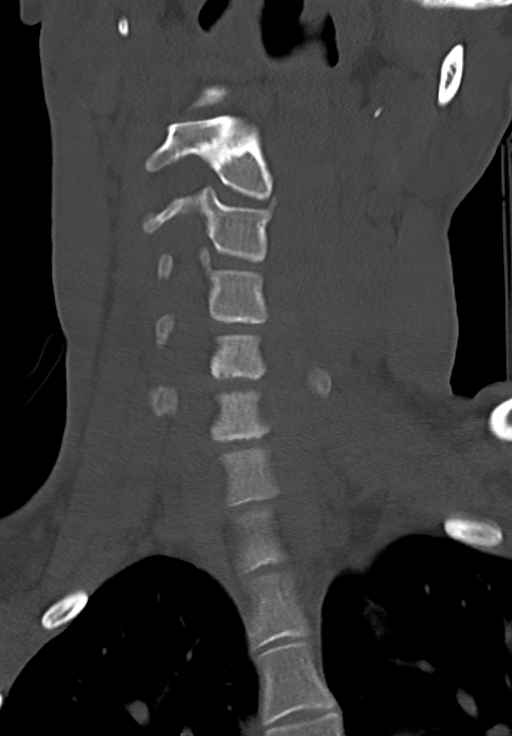
[im 30/69  bone]
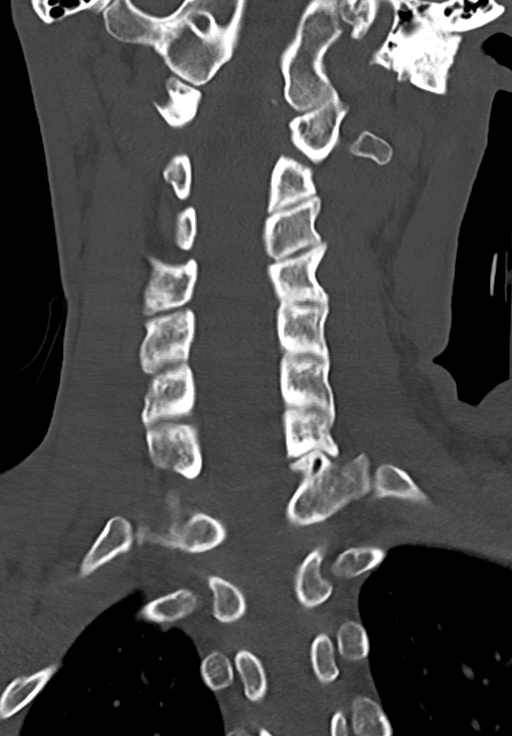
[im 40/69  bone]
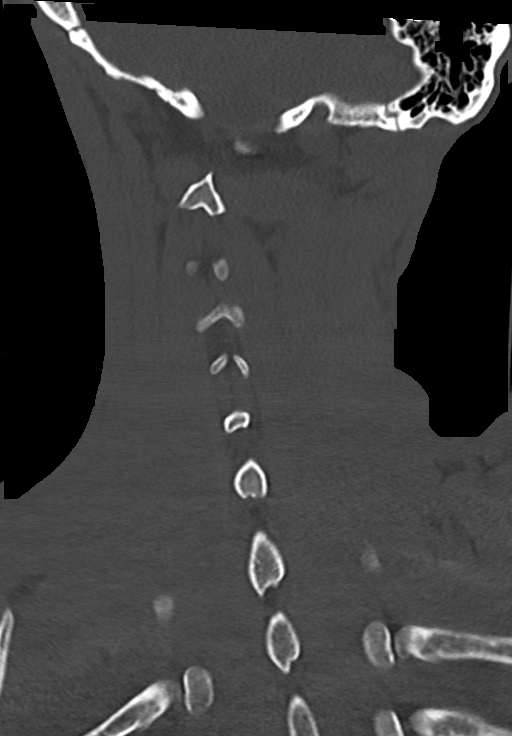

[Series 10: orthogonal axials · axial · 0.21mm/px · z∈[+1164,+1304]mm · 5 of 103 slices shown, 7 images]
[im 18/103  soft-tissue]
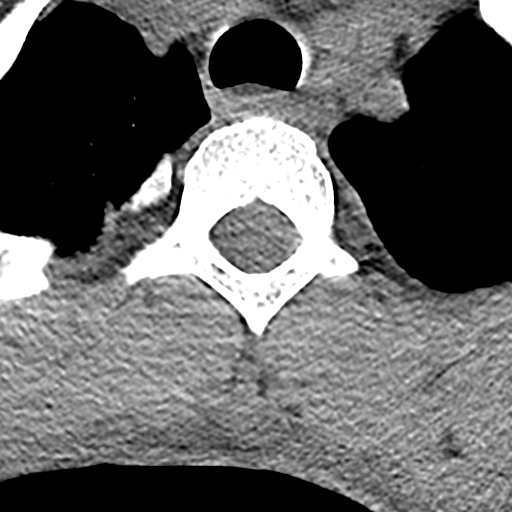
[im 18/103  bone]
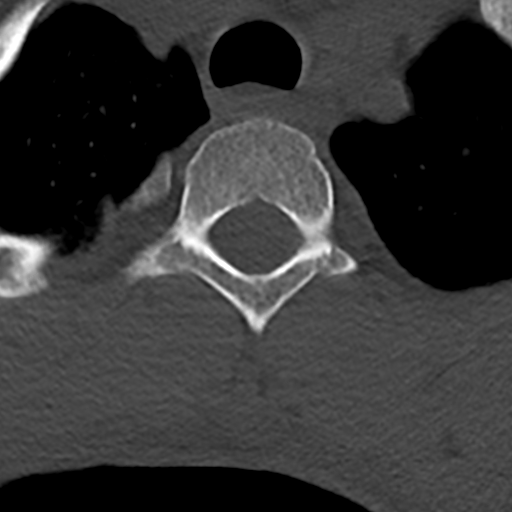
[im 35/103  bone]
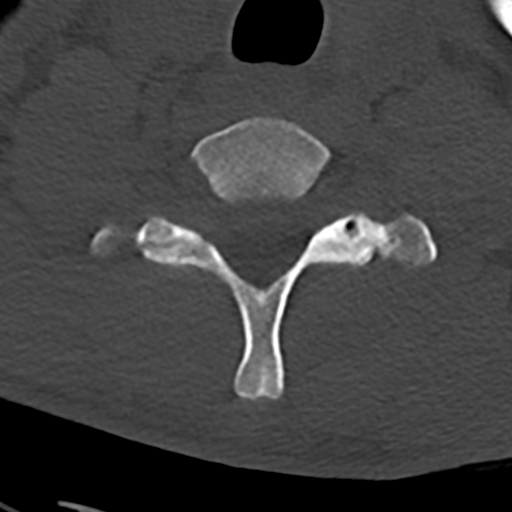
[im 52/103  bone]
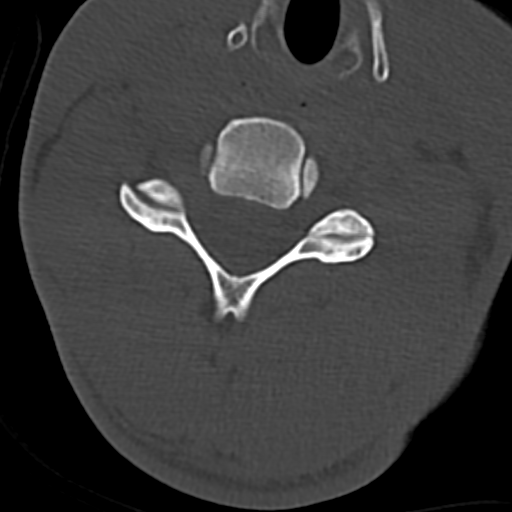
[im 69/103  bone]
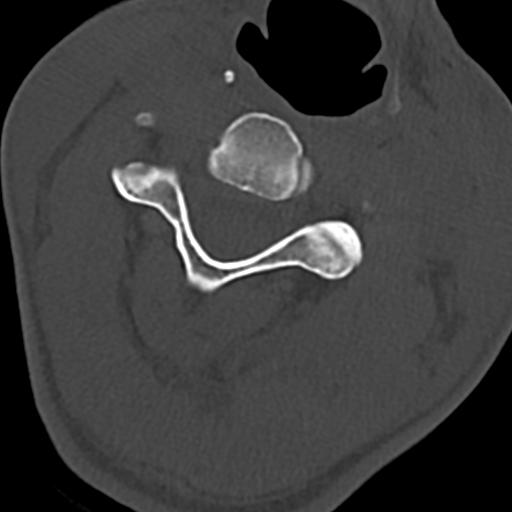
[im 86/103  soft-tissue]
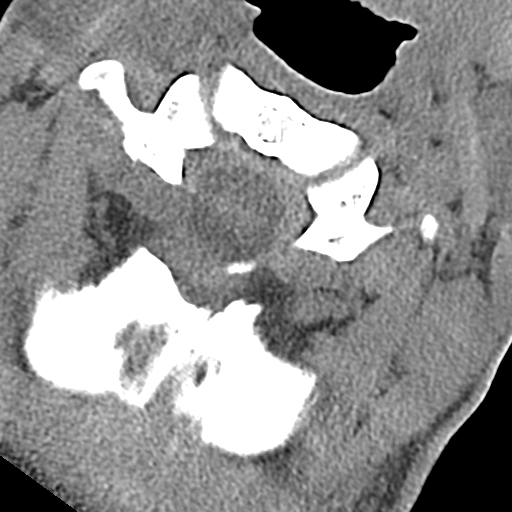
[im 86/103  bone]
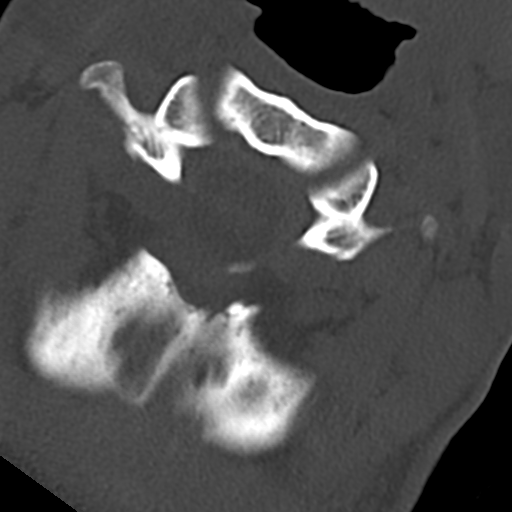

[13 of 34 positions shown; findings below may reference images not displayed]

FINDINGS: CT HEAD FINDINGS

Brain: No evidence of acute infarction, hemorrhage, hydrocephalus,
extra-axial collection or mass lesion/mass effect.

Vascular: No hyperdense vessel or unexpected calcification.

Skull: Normal. Negative for fracture or focal lesion.

Sinuses/Orbits: Mucosal thickening in the sinuses

Other: None

CT CERVICAL SPINE FINDINGS

Alignment: Straightening of the cervical spine. No subluxation.
Facet alignment within normal limits.

Skull base and vertebrae: No acute fracture. No primary bone lesion
or focal pathologic process.

Soft tissues and spinal canal: No prevertebral fluid or swelling. No
visible canal hematoma.

Disc levels:  Within normal limits

Upper chest: Negative.

Other: None
IMPRESSION: 1. Negative non contrasted CT appearance of the brain.
2. Straightening of the cervical spine. No acute osseous
abnormality.

## 2023-12-18 IMAGING — CT CT HEAD W/O CM
4 of 5 series · 15 of 47 positions shown, 17 images · non-contrast
Comparison: None Available.

CLINICAL DATA: Disoriented



[Series 3: head wo · axial · 0.45mm/px · z∈[+1325,+1435]mm · 5 of 34 slices shown, 7 images (1 of 2)]
[im 6/34  brain]
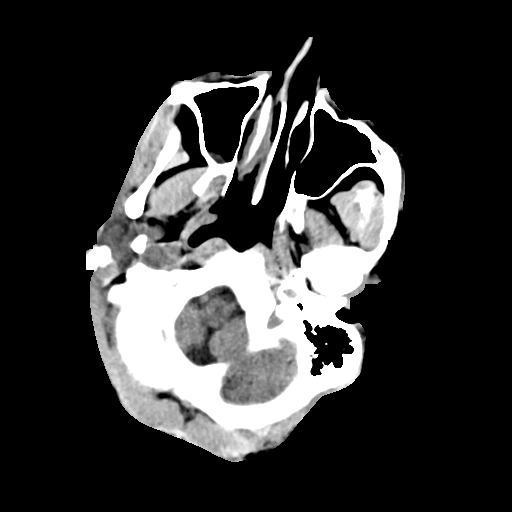
[im 6/34  bone]
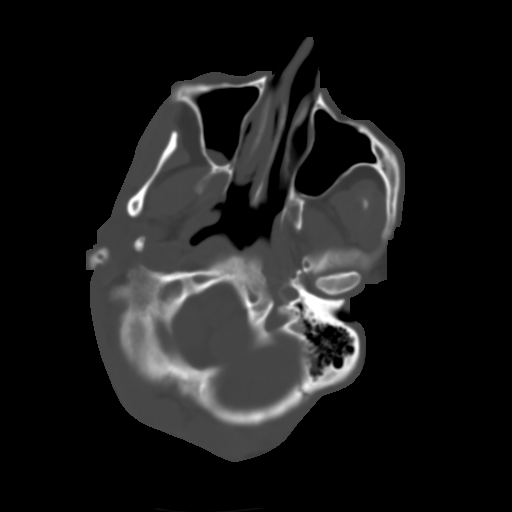
[im 12/34  brain]
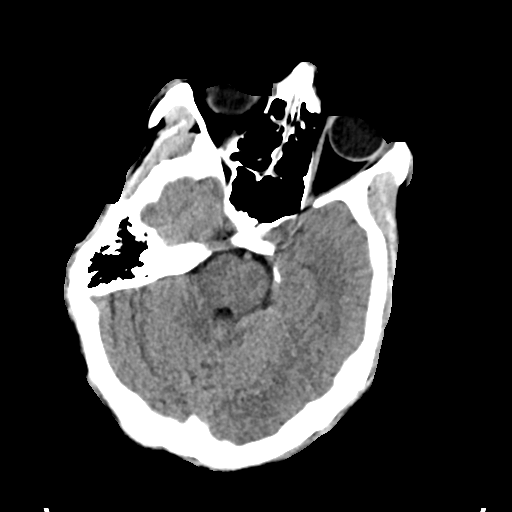
[im 17/34  brain]
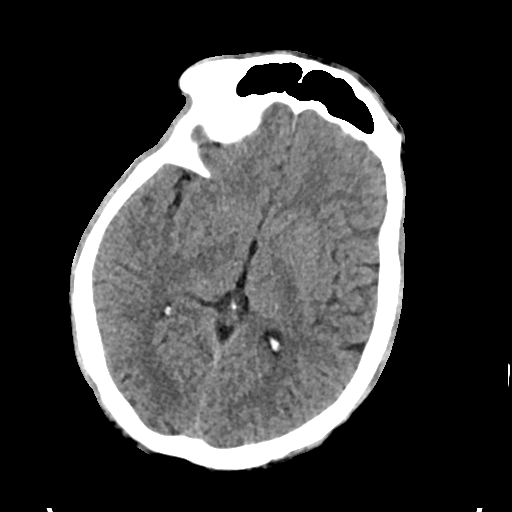
[im 23/34  brain]
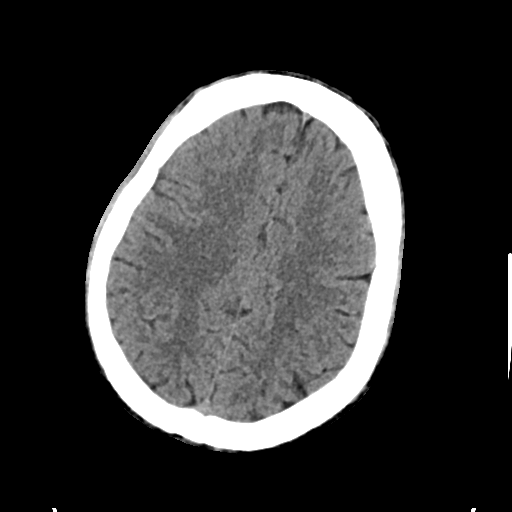
[im 28/34  brain]
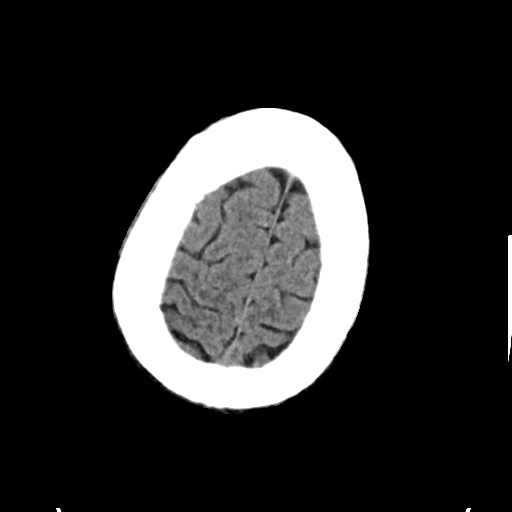
[im 28/34  bone]
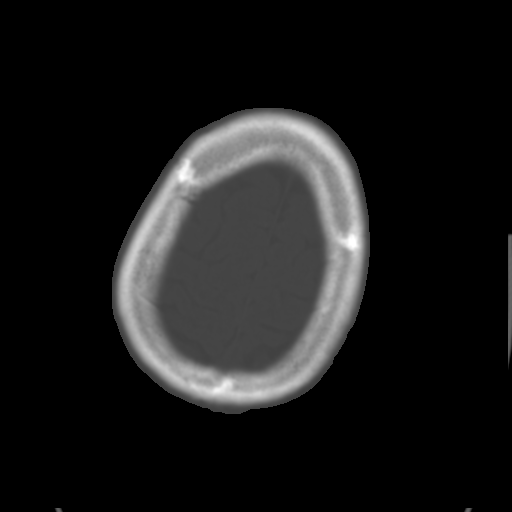

[Series 5: cor soft · coronal · 0.34mm/px · 3 of 72 slices shown]
[im 24/72  brain]
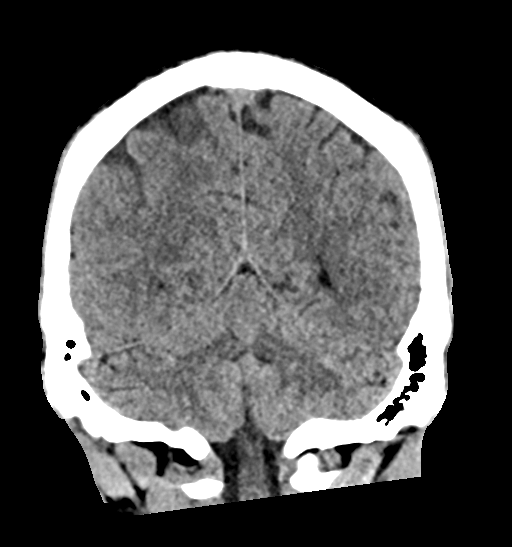
[im 32/72  brain]
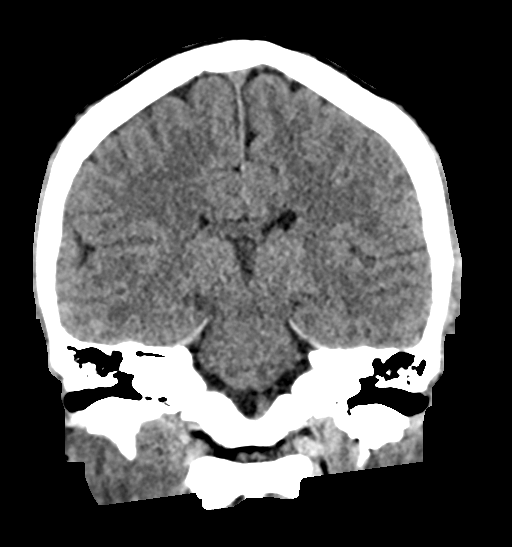
[im 40/72  brain]
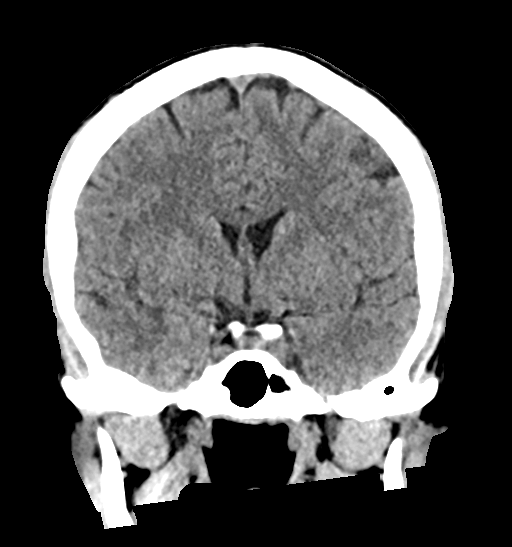

[Series 6: sag soft · sagittal · 0.37mm/px · 3 of 58 slices shown]
[im 21/58  brain]
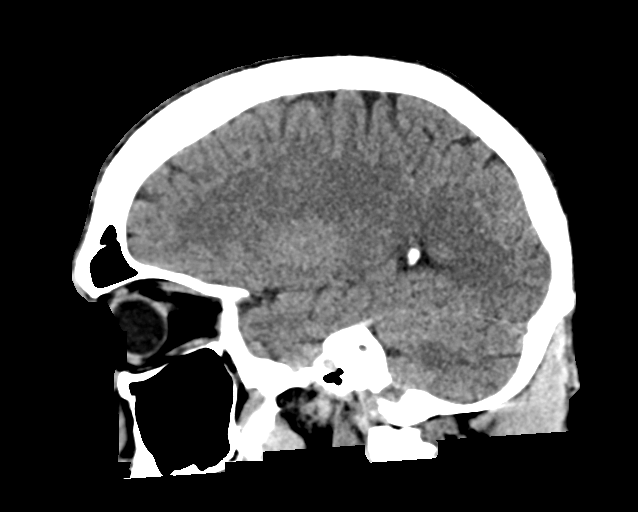
[im 29/58  brain]
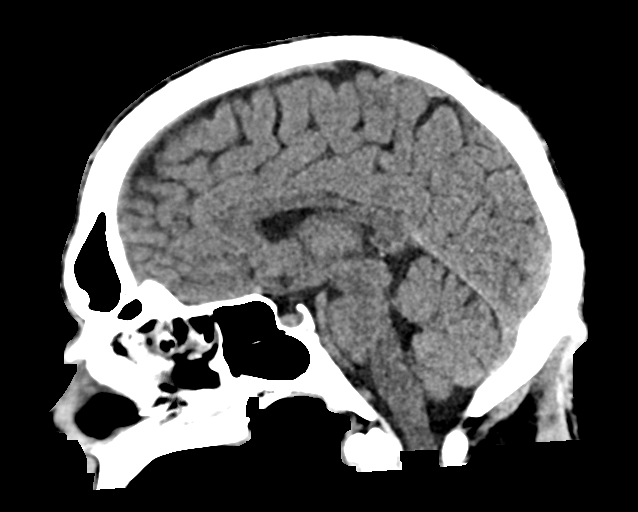
[im 37/58  brain]
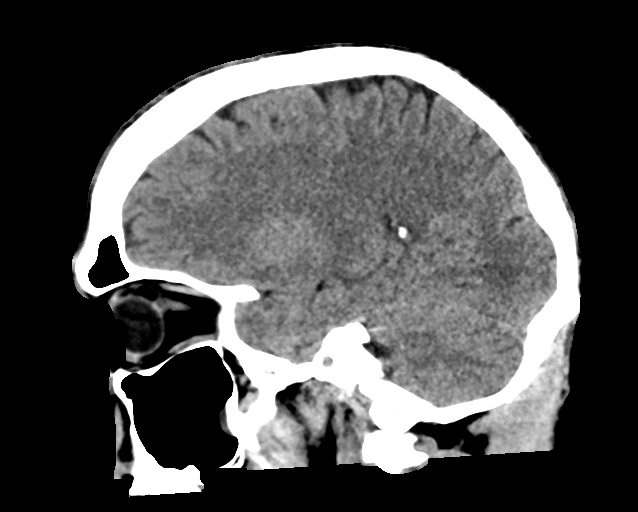

[Series 7: head wo · axial · 0.34mm/px · z∈[+1337,+1426]mm · 4 of 37 slices shown (2 of 2)]
[im 7/37  brain]
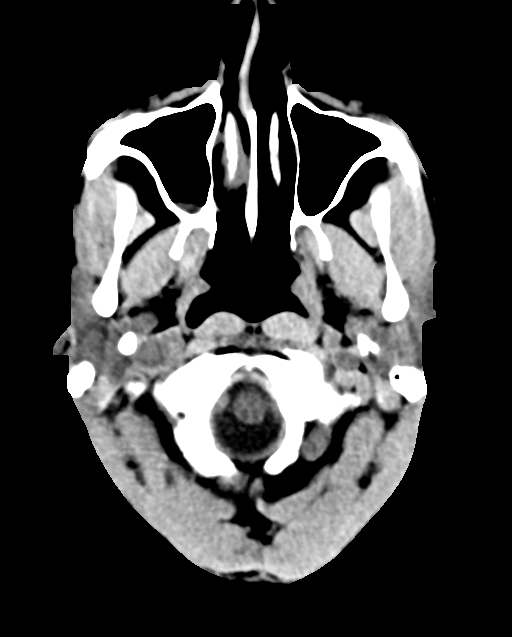
[im 13/37  brain]
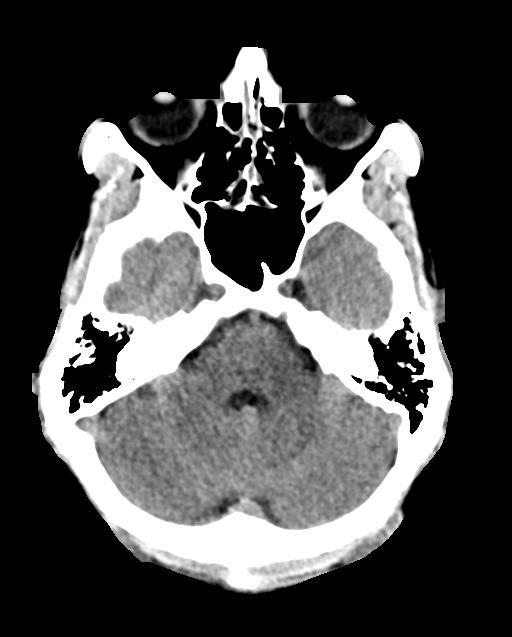
[im 19/37  brain]
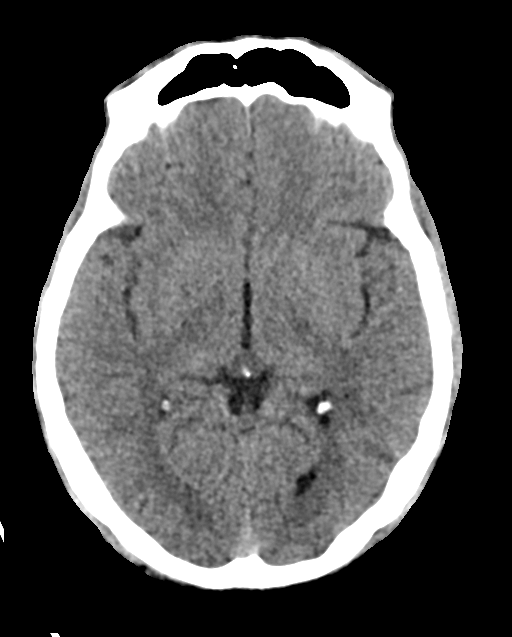
[im 25/37  brain]
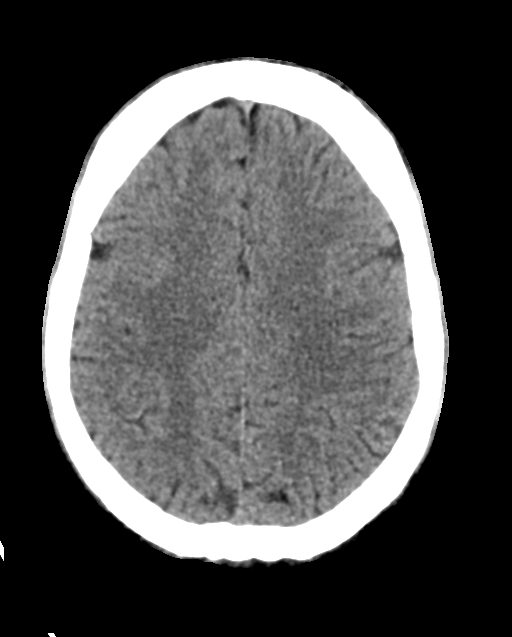

[15 of 47 positions shown; findings below may reference images not displayed]

FINDINGS: CT HEAD FINDINGS

Brain: No evidence of acute infarction, hemorrhage, hydrocephalus,
extra-axial collection or mass lesion/mass effect.

Vascular: No hyperdense vessel or unexpected calcification.

Skull: Normal. Negative for fracture or focal lesion.

Sinuses/Orbits: Mucosal thickening in the sinuses

Other: None

CT CERVICAL SPINE FINDINGS

Alignment: Straightening of the cervical spine. No subluxation.
Facet alignment within normal limits.

Skull base and vertebrae: No acute fracture. No primary bone lesion
or focal pathologic process.

Soft tissues and spinal canal: No prevertebral fluid or swelling. No
visible canal hematoma.

Disc levels:  Within normal limits

Upper chest: Negative.

Other: None
IMPRESSION: 1. Negative non contrasted CT appearance of the brain.
2. Straightening of the cervical spine. No acute osseous
abnormality.
